# Patient Record
Sex: Female | Born: 1970 | Hispanic: Yes | Marital: Married | Smoking: Never smoker
Health system: Southern US, Community
[De-identification: ages and names within clinical notes are randomized; demographics above are authoritative.]

## PROBLEM LIST (undated history)

## (undated) HISTORY — PX: CHOLECYSTECTOMY: SHX55

---

## 2005-10-08 ENCOUNTER — Emergency Department: Payer: Self-pay | Admitting: Emergency Medicine

## 2006-12-01 ENCOUNTER — Emergency Department: Payer: Self-pay | Admitting: Emergency Medicine

## 2007-01-12 ENCOUNTER — Emergency Department: Payer: Self-pay | Admitting: Unknown Physician Specialty

## 2007-04-04 ENCOUNTER — Encounter: Payer: Self-pay | Admitting: Maternal & Fetal Medicine

## 2007-08-20 ENCOUNTER — Inpatient Hospital Stay: Payer: Self-pay

## 2015-07-07 ENCOUNTER — Encounter: Payer: Self-pay | Admitting: Urgent Care

## 2015-07-07 ENCOUNTER — Emergency Department
Admission: EM | Admit: 2015-07-07 | Discharge: 2015-07-08 | Disposition: A | Discharge status: Home / Self Care | Payer: Self-pay | Attending: Emergency Medicine | Admitting: Emergency Medicine

## 2015-07-07 DIAGNOSIS — G43809 Other migraine, not intractable, without status migrainosus: Secondary | ICD-10-CM | POA: Insufficient documentation

## 2015-07-07 LAB — CBC
HEMATOCRIT: 35.1 % (ref 35.0–47.0)
HEMOGLOBIN: 11.9 g/dL — AB (ref 12.0–16.0)
MCH: 29.2 pg (ref 26.0–34.0)
MCHC: 33.9 g/dL (ref 32.0–36.0)
MCV: 86.2 fL (ref 80.0–100.0)
Platelets: 259 10*3/uL (ref 150–440)
RBC: 4.08 MIL/uL (ref 3.80–5.20)
RDW: 13.9 % (ref 11.5–14.5)
WBC: 9.2 10*3/uL (ref 3.6–11.0)

## 2015-07-07 MED ORDER — ONDANSETRON 4 MG PO TBDP: 4.0000 mg | ORAL_TABLET | Freq: Once | ORAL | Status: DC

## 2015-07-07 MED ORDER — HYDROMORPHONE HCL 1 MG/ML IJ SOLN
INTRAMUSCULAR | Status: AC
  Filled 2015-07-07: qty 1

## 2015-07-07 MED ORDER — KETOROLAC TROMETHAMINE 10 MG PO TABS: 10.0000 mg | ORAL_TABLET | Freq: Three times a day (TID) | ORAL | Status: DC | PRN

## 2015-07-07 MED ORDER — ONDANSETRON 4 MG PO TBDP
ORAL_TABLET | ORAL | Status: AC
  Filled 2015-07-07: qty 1

## 2015-07-07 MED ORDER — METOCLOPRAMIDE HCL 10 MG PO TABS
ORAL_TABLET | ORAL | Status: AC
  Filled 2015-07-07: qty 1

## 2015-07-07 MED ORDER — HYDROMORPHONE HCL 1 MG/ML IJ SOLN: 1.0000 mg | Freq: Once | INTRAMUSCULAR | Status: DC

## 2015-07-07 MED ORDER — DIPHENHYDRAMINE HCL 25 MG PO CAPS
ORAL_CAPSULE | ORAL | Status: AC
  Filled 2015-07-07: qty 1

## 2015-07-07 MED ORDER — HYDROMORPHONE HCL 1 MG/ML IJ SOLN
1.0000 mg | Freq: Once | INTRAMUSCULAR | Status: AC
  Administered 2015-07-07: 1 mg via INTRAMUSCULAR

## 2015-07-07 MED ORDER — METOCLOPRAMIDE HCL 10 MG PO TABS
10.0000 mg | ORAL_TABLET | Freq: Once | ORAL | Status: AC
  Administered 2015-07-07: 10 mg via ORAL
  Filled 2015-07-07: qty 1

## 2015-07-07 MED ORDER — SODIUM CHLORIDE 0.9 % IV BOLUS (SEPSIS)
1000.0000 mL | Freq: Once | INTRAVENOUS | Status: AC
  Administered 2015-07-07: 1000 mL via INTRAVENOUS

## 2015-07-07 MED ORDER — DIPHENHYDRAMINE HCL 25 MG PO CAPS
25.0000 mg | ORAL_CAPSULE | Freq: Once | ORAL | Status: AC
  Administered 2015-07-07: 25 mg via ORAL
  Filled 2015-07-07: qty 1

## 2015-07-07 MED ORDER — KETOROLAC TROMETHAMINE 60 MG/2ML IM SOLN
60.0000 mg | Freq: Once | INTRAMUSCULAR | Status: AC
  Administered 2015-07-07: 60 mg via INTRAMUSCULAR
  Filled 2015-07-07 (×2): qty 2

## 2015-07-07 MED ORDER — ONDANSETRON 4 MG PO TBDP
4.0000 mg | ORAL_TABLET | Freq: Once | ORAL | Status: AC
  Administered 2015-07-07: 4 mg via ORAL

## 2015-07-07 MED ORDER — DEXAMETHASONE SODIUM PHOSPHATE 10 MG/ML IJ SOLN
10.0000 mg | Freq: Once | INTRAMUSCULAR | Status: AC
  Administered 2015-07-07: 10 mg via INTRAVENOUS
  Filled 2015-07-07: qty 1

## 2015-07-07 MED ORDER — METOCLOPRAMIDE HCL 5 MG PO TABS: 5.0000 mg | ORAL_TABLET | Freq: Three times a day (TID) | ORAL | Status: DC | PRN

## 2015-07-07 NOTE — ED Provider Notes (Signed)
[facility] Emergency Department Provider Note  ____________________________________________  Time seen: Approximately 10:02 PM  I have reviewed the triage vital signs and the nursing notes.   HISTORY  Chief Complaint Headache    HPI Theresa Manning is a 45 y.o. female with a history of headaches presenting with headache. The patient reports that for a week she has had a progressively worsening daily constant headache. It is mostly frontal and associated with photophobia. She has had associated nausea but no vomiting. No tick bites, travel outside denies states, trauma, or rash. She is not had any fever or stiff neck. He has tried 400 mg of ibuprofen without improvement.   History reviewed. No pertinent past medical history.  There are no active problems to display for this patient.   History reviewed. No pertinent past surgical history.  Current Outpatient Rx  Name  Route  Sig  Dispense  Refill  . ketorolac (TORADOL) 10 MG tablet   Oral   Take 1 tablet (10 mg total) by mouth every 8 (eight) hours as needed for moderate pain (with food).   15 tablet   0   . metoCLOPramide (REGLAN) 5 MG tablet   Oral   Take 1 tablet (5 mg total) by mouth every 8 (eight) hours as needed for nausea or vomiting.   12 tablet   0     Allergies Review of patient's allergies indicates no known allergies.  No family history on file.  Social History Social History  Substance Use Topics  . Smoking status: Never Smoker   . Smokeless tobacco: None  . Alcohol Use: No    Review of Systems Constitutional: No fever/chills.No trauma. Eyes: No visual changes. Positive photophobia. ENT: No sore throat. No congestion or rhinorrhea. Cardiovascular: Denies chest pain. Denies palpitations. Respiratory: Denies shortness of breath.  No cough. Gastrointestinal: No abdominal pain.  No nausea, no vomiting.  No diarrhea.  No constipation. Genitourinary: Negative for  dysuria. Musculoskeletal: Negative for back pain. Skin: Negative for rash. Neurological: Positive for headaches. No focal numbness, tingling or weakness. No difficulty walking.  10-point ROS otherwise negative.  ____________________________________________   PHYSICAL EXAM:  VITAL SIGNS: ED Triage Vitals  Enc Vitals Group     BP 07/07/15 1955 118/69 mmHg     Pulse Rate 07/07/15 1955 57     Resp 07/07/15 1955 16     Temp 07/07/15 1955 98.6 F (37 C)     Temp Source 07/07/15 1955 Oral     SpO2 07/07/15 1955 98 %     Weight 07/07/15 1955 178 lb (80.74 kg)     Height 07/07/15 1955  (1.676 m)     Head Cir --      Peak Flow --      Pain Score 07/07/15 1955 10     Pain Loc --      Pain Edu? --      Excl. in GC? --     Constitutional: Alert and oriented. Mildly uncomfortable appearing but nontoxic.Marland Kitchen Answers questions appropriately. Eyes: Conjunctivae are normal.  EOMI. PERRLA. No scleral icterus. No eye discharge. Head: Atraumatic. Nose: No congestion/rhinnorhea. Mouth/Throat: Mucous membranes are moist.  Neck: No stridor.  Supple.  No stiff neck or meningismus. Cardiovascular: Normal rate, regular rhythm. No murmurs, rubs or gallops.  Respiratory: Normal respiratory effort.  No accessory muscle use or retractions. Lungs CTAB.  No wheezes, rales or ronchi. Gastrointestinal: Soft, nontender and nondistended.  No guarding or rebound.  No peritoneal  signs. Musculoskeletal: No LE edema. No ttp in the calves or palpable cords.  Negative Homan's sign. Neurologic:  Alert and oriented 3. Speech is clear. Face and smile are symmetric. EOMI and PERRLA. No nystagmus. Moves all extremities well. Gait without ataxia.  Skin:  Skin is warm, dry and intact. No rash noted. Psychiatric: Mood and affect are normal. Speech and behavior are normal.  Normal judgement.  ____________________________________________   LABS (all labs ordered are listed, but only abnormal results are  displayed)  Labs Reviewed  CBC  BASIC METABOLIC PANEL  URINALYSIS COMPLETEWITH MICROSCOPIC ([facility] ONLY)  HCG, QUANTITATIVE, PREGNANCY   ____________________________________________  EKG  Not indicated ____________________________________________  RADIOLOGY  No results found.  ____________________________________________   PROCEDURES  Procedure(s) performed: None  Critical Care performed: No ____________________________________________   INITIAL IMPRESSION / ASSESSMENT AND PLAN / ED COURSE  Pertinent labs & imaging results that were available during my care of the patient were reviewed by me and considered in my medical decision making (see chart for details).  45 y.o. female with a history of headaches presenting with 1 week of progressively worsening headache with photophobia. On my exam it did not see an acute neurologic deficits. She is not febrile and has no meningismus. Think the most likely cause of her headache is a migraine, and intracranial mass, intracranial bleeding, or meningitis is much less likely. I will plan to treat her symptomatically and reevaluate her.  ----------------------------------------- 10:41 PM on 07/07/2015 -----------------------------------------  The patient states that she has not felt any improvement in her pain. I will re-dose her with pain medication and an antiemetic, and reevaluate the patient. At this time, she remains hemodynamically stable and is resting comfortably in the stretcher.  ----------------------------------------- 11:00 PM on 07/07/2015 -----------------------------------------  The patient's pain continues to be 10 out of 10 and she continues to be photosensitive. I will initiate basic laboratory studies, and an IV for increased pain control. I will sign her out to the oncoming physician, who will reevaluate her pain and follow up her studies. Final disposition to be made by the oncoming  doctor.  ____________________________________________  FINAL CLINICAL IMPRESSION(S) / ED DIAGNOSES  Final diagnoses:  Other migraine without status migrainosus, not intractable      NEW MEDICATIONS STARTED DURING THIS VISIT:  New Prescriptions   KETOROLAC (TORADOL) 10 MG TABLET    Take 1 tablet (10 mg total) by mouth every 8 (eight) hours as needed for moderate pain (with food).   METOCLOPRAMIDE (REGLAN) 5 MG TABLET    Take 1 tablet (5 mg total) by mouth every 8 (eight) hours as needed for nausea or vomiting.     Rockne MenghiniAnne-Caroline Adalyna Godbee, MD 07/07/15 [phone]

## 2015-07-07 NOTE — ED Notes (Signed)
Patient presents with a non-specific headache x 1 week. Denies N/V, visual changes, and neck pain. NOS reported at this time. Patient grossly Neuro intact in triage.

## 2015-07-07 NOTE — Discharge Instructions (Signed)
Please get plenty of rest, drink plenty of fluid and eat small regular meals at the day to prevent headache. If you develop a headache, take Toradol and Reglan immediately. Do not wait until the headache has worsened, as it is much more difficult to treat at that point. Do not take any other NSAID medications such as Aleve, ibuprofen, Motrin or Advil if you're taking Toradol.  Return to the emergency department if he develops severe pain, visual changes, inability to keep down fluids, fever, or any other symptoms concerning to you.  Cefalea migraosa (Migraine Headache) Una cefalea migraosa es un dolor intenso y punzante en uno o ambos lados de la cabeza. La migraa puede durar desde 30 minutos hasta varias horas. CAUSAS  No siempre se conoce la causa exacta de la cefalea migraosa. Sin embargo, IT consultantpueden aparecer Circuit Citycuando los nervios del cerebro se irritan y liberan ciertas sustancias qumicas que causan inflamacin. Esto ocasiona dolor. Existen tambin ciertos factores que pueden desencadenar las migraas, como los siguientes:  Alcohol.  Fumar.  Estrs.  La menstruacin  Quesos aejados.  Los alimentos o las bebidas que contienen nitratos, glutamato, aspartamo o tiramina.  Falta de sueo.  Chocolate.  Cafena.  Hambre.  Actividad fsica extenuante.  Fatiga.  Medicamentos que se usan para tratar Aeronautical engineerel dolor en el pecho (nitroglicerina), pldoras anticonceptivas, estrgeno y algunos medicamentos para la hipertensin arterial. SIGNOS Y SNTOMAS  Dolor en uno o ambos lados de la cabeza.  Dolor pulsante o punzante.  Dolor intenso que impide Ameren Corporationrealizar las actividades diarias.  Dolor que se agrava por cualquier actividad fsica.  Nuseas, vmitos o ambos.  Mareos.  Dolor con la exposicin a las luces brillantes, a los ruidos fuertes o la [place]actividad.  Sensibilidad general a las luces brillantes, a los ruidos fuertes o a los Limited Brandsolores. Antes de sufrir una migraa, puede recibir  seales de advertencia (aura). Un aura puede incluir:  Ver las luces intermitentes.  Ver puntos brillantes, halos o lneas en zigzag.  Tener una visin en tnel o visin borrosa.  Sensacin de entumecimiento u hormigueo.  Dificultad para hablar  Debilidad muscular. DIAGNSTICO  La cefalea migraosa se diagnostica en funcin de lo siguiente:  Sntomas.  Examen fsico.  Neomia DearUna TC (tomografa computada) o resonancia magntica de la cabeza. Estas pruebas de diagnstico por imagen no pueden diagnosticar las migraas, pero pueden ayudar a Sales promotion account executivedescartar otras causas de las cefaleas. TRATAMIENTO Le prescribirn medicamentos para Engineer, materialsaliviar el dolor y las nuseas. Tambin podrn administrarse medicamentos para ayudar a Armed forces training and education officerprevenir las migraas recurrentes.  INSTRUCCIONES PARA EL CUIDADO EN EL HOGAR  Slo tome medicamentos de venta libre o recetados para Primary school teachercalmar el dolor o Environmental health practitionerel malestar, segn las indicaciones de su mdico. No se recomienda usar los opiceos a Air cabin crewlargo plazo.  Cuando tenga la migraa, acustese en un cuarto oscuro y tranquilo  Lleve un registro diario para Financial risk analystaveriguar lo que puede provocar las Soil scientistcefaleas migraosas. Por ejemplo, escriba:  Lo que usted come y bebe.  Cunto tiempo duerme.  Algn cambio en su dieta o en los medicamentos.  Limite el consumo de bebidas alcohlicas.  Si fuma, deje de hacerlo.  Duerma entre 7 y 9horas, o segn las recomendaciones del mdico.  Limite el estrs.  Mantenga las luces tenues si le Goodrich Corporationmolestan las luces brillantes y la Mapleviewmigraa empeora. SOLICITE ATENCIN MDICA DE INMEDIATO SI:   La migraa se hace cada vez ms intensa.  Tiene fiebre.  Presenta rigidez en el cuello.  Tiene prdida de visin.  Presenta debilidad muscular  o prdida del control muscular.  Comienza a perder el equilibrio o tiene problemas para caminar.  Sufre mareos o se desmaya.  Tiene sntomas graves que son diferentes a los primeros sntomas. ASEGRESE DE QUE:    Comprende estas instrucciones.  Controlar su afeccin.  Recibir ayuda de inmediato si no mejora o si empeora.   Esta informacin no tiene Theme park manager el consejo del mdico. Asegrese de hacerle al mdico cualquier pregunta que tenga.   Document Released: 02/06/2005 Document Revised: 11/27/2012 Elsevier Interactive Patient Education [employer]

## 2015-07-07 NOTE — ED Notes (Signed)
Pt assessment done through interpreter

## 2015-07-08 ENCOUNTER — Emergency Department: Payer: Self-pay

## 2015-07-08 LAB — BASIC METABOLIC PANEL
ANION GAP: 7 (ref 5–15)
BUN: 13 mg/dL (ref 6–20)
CALCIUM: 8.7 mg/dL — AB (ref 8.9–10.3)
CO2: 24 mmol/L (ref 22–32)
Chloride: 108 mmol/L (ref 101–111)
Creatinine, Ser: 0.58 mg/dL (ref 0.44–1.00)
GFR calc Af Amer: >60 mL/min (ref >60)
GLUCOSE: 94 mg/dL (ref 65–99)
Potassium: 3.3 mmol/L — ABNORMAL LOW (ref 3.5–5.1)
SODIUM: 139 mmol/L (ref 135–145)

## 2015-07-08 LAB — URINALYSIS COMPLETE WITH MICROSCOPIC (ARMC ONLY)
BILIRUBIN URINE: NEGATIVE
GLUCOSE, UA: NEGATIVE mg/dL
Hgb urine dipstick: NEGATIVE
KETONES UR: NEGATIVE mg/dL
LEUKOCYTES UA: NEGATIVE
Nitrite: NEGATIVE
Protein, ur: NEGATIVE mg/dL
Specific Gravity, Urine: 1.028 (ref 1.005–1.030)
pH: 5 (ref 5.0–8.0)

## 2015-07-08 LAB — HCG, QUANTITATIVE, PREGNANCY: hCG, Beta Chain, Quant, S: <1 m[IU]/mL (ref <5)

## 2015-07-08 LAB — POCT PREGNANCY, URINE: PREG TEST UR: NEGATIVE

## 2015-07-08 MED ORDER — IOPAMIDOL (ISOVUE-370) INJECTION 76%
100.0000 mL | Freq: Once | INTRAVENOUS | Status: AC | PRN
  Administered 2015-07-08: 100 mL via INTRAVENOUS

## 2015-07-08 MED ORDER — OXYCODONE-ACETAMINOPHEN 5-325 MG PO TABS
2.0000 | ORAL_TABLET | Freq: Once | ORAL | Status: AC
  Administered 2015-07-08: 2 via ORAL
  Filled 2015-07-08: qty 2

## 2015-07-08 NOTE — ED Notes (Signed)
Urine preg negative

## 2018-05-04 ENCOUNTER — Other Ambulatory Visit: Payer: Self-pay

## 2018-05-04 ENCOUNTER — Encounter: Payer: Self-pay | Admitting: Emergency Medicine

## 2018-05-04 ENCOUNTER — Emergency Department: Payer: 59

## 2018-05-04 ENCOUNTER — Emergency Department
Admission: EM | Admit: 2018-05-04 | Discharge: 2018-05-05 | Disposition: A | Discharge status: Home / Self Care | Payer: 59 | Attending: Emergency Medicine | Admitting: Emergency Medicine

## 2018-05-04 DIAGNOSIS — R197 Diarrhea, unspecified: Secondary | ICD-10-CM | POA: Insufficient documentation

## 2018-05-04 DIAGNOSIS — N390 Urinary tract infection, site not specified: Secondary | ICD-10-CM

## 2018-05-04 DIAGNOSIS — R1011 Right upper quadrant pain: Secondary | ICD-10-CM | POA: Diagnosis present

## 2018-05-04 DIAGNOSIS — Z209 Contact with and (suspected) exposure to unspecified communicable disease: Secondary | ICD-10-CM | POA: Insufficient documentation

## 2018-05-04 DIAGNOSIS — R109 Unspecified abdominal pain: Secondary | ICD-10-CM

## 2018-05-04 LAB — CBC
HEMATOCRIT: 40.8 % (ref 36.0–46.0)
Hemoglobin: 13.2 g/dL (ref 12.0–15.0)
MCH: 28.8 pg (ref 26.0–34.0)
MCHC: 32.4 g/dL (ref 30.0–36.0)
MCV: 89.1 fL (ref 80.0–100.0)
Platelets: 321 10*3/uL (ref 150–400)
RBC: 4.58 MIL/uL (ref 3.87–5.11)
RDW: 13.7 % (ref 11.5–15.5)
WBC: 11.8 10*3/uL — AB (ref 4.0–10.5)
nRBC: 0 % (ref 0.0–0.2)

## 2018-05-04 LAB — COMPREHENSIVE METABOLIC PANEL
ALBUMIN: 4.3 g/dL (ref 3.5–5.0)
ALT: 27 U/L (ref 0–44)
AST: 19 U/L (ref 15–41)
Alkaline Phosphatase: 79 U/L (ref 38–126)
Anion gap: 7 (ref 5–15)
BILIRUBIN TOTAL: 1.5 mg/dL — AB (ref 0.3–1.2)
BUN: 12 mg/dL (ref 6–20)
CHLORIDE: 108 mmol/L (ref 98–111)
CO2: 24 mmol/L (ref 22–32)
CREATININE: 0.56 mg/dL (ref 0.44–1.00)
Calcium: 8.5 mg/dL — ABNORMAL LOW (ref 8.9–10.3)
GFR calc non Af Amer: >60 mL/min (ref >60)
Glucose, Bld: 93 mg/dL (ref 70–99)
Potassium: 3.7 mmol/L (ref 3.5–5.1)
SODIUM: 139 mmol/L (ref 135–145)
TOTAL PROTEIN: 7.2 g/dL (ref 6.5–8.1)

## 2018-05-04 LAB — URINALYSIS, COMPLETE (UACMP) WITH MICROSCOPIC
BILIRUBIN URINE: NEGATIVE
Glucose, UA: NEGATIVE mg/dL
KETONES UR: NEGATIVE mg/dL
Nitrite: NEGATIVE
PROTEIN: NEGATIVE mg/dL
Specific Gravity, Urine: 1.02 (ref 1.005–1.030)
pH: 6 (ref 5.0–8.0)

## 2018-05-04 LAB — POCT PREGNANCY, URINE: PREG TEST UR: NEGATIVE

## 2018-05-04 LAB — LIPASE, BLOOD: Lipase: 22 U/L (ref 11–51)

## 2018-05-04 MED ORDER — IOHEXOL 300 MG/ML  SOLN
100.0000 mL | Freq: Once | INTRAMUSCULAR | Status: AC | PRN
  Administered 2018-05-04: 100 mL via INTRAVENOUS

## 2018-05-04 MED ORDER — IOPAMIDOL (ISOVUE-300) INJECTION 61%
30.0000 mL | Freq: Once | INTRAVENOUS | Status: AC | PRN
  Administered 2018-05-04: 30 mL via ORAL

## 2018-05-04 MED ORDER — ONDANSETRON HCL 4 MG/2ML IJ SOLN
4.0000 mg | Freq: Once | INTRAMUSCULAR | Status: AC
  Administered 2018-05-04: 4 mg via INTRAVENOUS
  Filled 2018-05-04: qty 2

## 2018-05-04 MED ORDER — SODIUM CHLORIDE 0.9 % IV BOLUS: 1000.0000 mL | Freq: Once | INTRAVENOUS | Status: DC

## 2018-05-04 MED ORDER — SODIUM CHLORIDE 0.9 % IV SOLN
1.0000 g | Freq: Once | INTRAVENOUS | Status: AC
  Administered 2018-05-04: 1 g via INTRAVENOUS
  Filled 2018-05-04: qty 10

## 2018-05-04 MED ORDER — SODIUM CHLORIDE 0.9% FLUSH: 3.0000 mL | Freq: Once | INTRAVENOUS | Status: DC

## 2018-05-04 MED ORDER — CEPHALEXIN 500 MG PO CAPS: 500.0000 mg | ORAL_CAPSULE | Freq: Three times a day (TID) | ORAL | 0 refills | Status: AC

## 2018-05-04 MED ORDER — FENTANYL CITRATE (PF) 100 MCG/2ML IJ SOLN
50.0000 ug | Freq: Once | INTRAMUSCULAR | Status: AC
  Administered 2018-05-04: 50 ug via INTRAVENOUS
  Filled 2018-05-04: qty 2

## 2018-05-04 MED ORDER — ACETAMINOPHEN 325 MG PO TABS
650.0000 mg | ORAL_TABLET | Freq: Once | ORAL | Status: AC
  Administered 2018-05-05: 650 mg via ORAL
  Filled 2018-05-04: qty 2

## 2018-05-04 NOTE — ED Triage Notes (Signed)
Nausea, vomiting and abdominal pain began this am.  

## 2018-05-04 NOTE — Discharge Instructions (Addendum)
Si se siente peor de [place], incluyendo fiebre alta o aumento del dolor o vmitos, regrese al departamento de emergencia

## 2018-05-04 NOTE — ED Notes (Signed)
Pt updated about wait. Family verbalizes understanding.

## 2018-05-04 NOTE — ED Provider Notes (Addendum)
[facility] Provider Note  ____________________________________________   I have reviewed the triage vital signs and the nursing notes. Where available I have reviewed prior notes and, if possible and indicated, outside hospital notes.    HISTORY  Chief Complaint Abdominal Pain; Emesis; and Diarrhea    HPI Theresa Manning is a 48 y.o. female who is healthy, states that she is had her appendix out before although the notes that she has had her gallbladder out she adamantly states that is her appendix.  In any event, she is here with nausea vomiting diarrhea for the last 24 hours or so.  She is had fevers at home.  She denies melena bright red blood per rectum or hematemesis.  No recent travel, positive sick contacts.  Patient speaks Spanish, she is very comfortable with my Spanish she does not want me to call for an interpreter at this time, she states that she changes her mind she will let me know.  She feels that there are no barriers to communication between us at this time.  She states that the pain only began after the vomiting.  She states that she has had no headache no cough no rhinorrhea no cold symptoms and she denies dysuria, she is only had urinary frequency. She has had copious amounts of diarrhea and copious amounts of vomiting however.   History reviewed. No pertinent past medical history.  There are no active problems to display for this patient.   Past Surgical History:  Procedure Laterality Date  . CHOLECYSTECTOMY      Prior to Admission medications   Medication Sig Start Date End Date Taking? Authorizing Provider  ketorolac (TORADOL) 10 MG tablet Take 1 tablet (10 mg total) by mouth every 8 (eight) hours as needed for moderate pain (with food). 07/07/15   Rockne MenghiniNorman, Anne-Caroline, MD  metoCLOPramide (REGLAN) 5 MG tablet Take 1 tablet (5 mg total) by mouth every 8 (eight) hours as needed for nausea or vomiting.  07/07/15 07/06/16  Rockne MenghiniNorman, Anne-Caroline, MD    Allergies Patient has no known allergies.  No family history on file.  Social History Social History   Tobacco Use  . Smoking status: Never Smoker  Substance Use Topics  . Alcohol use: No  . Drug use: Not on file    Review of Systems Constitutional: + fever/chills Eyes: No visual changes. ENT: No sore throat. No stiff neck no neck pain Cardiovascular: Denies chest pain. Respiratory: Denies shortness of breath. Gastrointestinal:   See HPI Genitourinary: Negative for dysuria. Musculoskeletal: Negative lower extremity swelling Skin: Negative for rash. Neurological: Negative for severe headaches, focal weakness or numbness.   ____________________________________________   PHYSICAL EXAM:  VITAL SIGNS: ED Triage Vitals  Enc Vitals Group     BP 05/04/18 1656 (!) 145/90     Pulse Rate 05/04/18 1656 84     Resp 05/04/18 1656 20     Temp 05/04/18 1656 (!) 100.7 F (38.2 C)     Temp Source 05/04/18 1656 Oral     SpO2 05/04/18 1656 99 %     Weight 05/04/18 1658 197 lb (89.4 kg)     Height --      Head Circumference --      Peak Flow --      Pain Score 05/04/18 1657 8     Pain Loc --      Pain Edu? --      Excl. in GC? --  Constitutional: Alert and oriented. Well appearing and in no acute distress. Eyes: Conjunctivae are normal Head: Atraumatic HEENT: No congestion/rhinnorhea. Mucous membranes are moist.  Oropharynx non-erythematous Neck:   Nontender with no meningismus, no masses, no stridor Cardiovascular: Normal rate, regular rhythm. Grossly normal heart sounds.  Good peripheral circulation. Respiratory: Normal respiratory effort.  No retractions. Lungs CTAB. Abdominal: Soft and positive right upper quadrant epigastric discomfort surgical abdomen. No distention. No guarding no rebound Back:  There is no focal tenderness or step off.  there is no midline tenderness there are no lesions noted. there is no CVA  tenderness Musculoskeletal: No lower extremity tenderness, no upper extremity tenderness. No joint effusions, no DVT signs strong distal pulses no edema Neurologic:  Normal speech and language. No gross focal neurologic deficits are appreciated.  Skin:  Skin is warm, dry and intact. No rash noted. Psychiatric: Mood and affect are normal. Speech and behavior are normal.  ____________________________________________   LABS (all labs ordered are listed, but only abnormal results are displayed)  Labs Reviewed  COMPREHENSIVE METABOLIC PANEL - Abnormal; Notable for the following components:      Result Value   Calcium 8.5 (*)    Total Bilirubin 1.5 (*)    All other components within normal limits  CBC - Abnormal; Notable for the following components:   WBC 11.8 (*)    All other components within normal limits  URINALYSIS, COMPLETE (UACMP) WITH MICROSCOPIC - Abnormal; Notable for the following components:   Color, Urine YELLOW (*)    APPearance HAZY (*)    Hgb urine dipstick MODERATE (*)    Leukocytes,Ua SMALL (*)    Bacteria, UA RARE (*)    All other components within normal limits  URINE CULTURE  LIPASE, BLOOD  POC URINE PREG, ED  POCT PREGNANCY, URINE    Pertinent labs  results that were available during my care of the patient were reviewed by me and considered in my medical decision making (see chart for details). ____________________________________________  EKG  I personally interpreted any EKGs ordered by me or triage Sinus rhythm rate 82 bpm no acute ST elevation or depression nonspecific ST changes no acute ischemia ____________________________________________  RADIOLOGY  Pertinent labs & imaging results that were available during my care of the patient were reviewed by me and considered in my medical decision making (see chart for details). If possible, patient and/or family made aware of any abnormal findings.  No results  found. ____________________________________________    PROCEDURES  Procedure(s) performed: None  Procedures  Critical Care performed: None  ____________________________________________   INITIAL IMPRESSION / ASSESSMENT AND PLAN / ED COURSE  Pertinent labs & imaging results that were available during my care of the patient were reviewed by me and considered in my medical decision making (see chart for details).  Here with nausea vomiting diarrhea, concern for viral syndrome is very high.  There are multiple people in the community with these acting symptoms with a fever at this time I have treated several over the last few days.  However, she does have some upper abdominal discomfort, we will obtain right upper quadrant ultrasound and she insists that she still has her gallbladder, and we will see if the medication fluid and pain medication help her feel better.  Patient's urine is somewhat equivocal, urine culture has been sent.   ----------------------------------------- 11:26 PM on 05/04/2018 -----------------------------------------  In ambulatory no distress feeling better, we will see if CT scan shows any other cause of her abdominal  pain otherwise we will treat her with antibiotics as we are doing for her UTI, and we will discharge her hopefully after that.  CT is pending at this time.  Signed out to Dr. Fanny Bien at the end of my shift    ____________________________________________   FINAL CLINICAL IMPRESSION(S) / ED DIAGNOSES  Final diagnoses:  Abdominal pain      This chart was dictated using voice recognition software.  Despite best efforts to proofread,  errors can occur which can change meaning.   3    Jeanmarie Plant, MD 05/04/18 2054    Jeanmarie Plant, MD 05/04/18 2059    Jeanmarie Plant, MD 05/04/18 [phone]

## 2018-05-04 NOTE — ED Notes (Signed)
Patient transported to Ultrasound 

## 2018-05-06 LAB — URINE CULTURE

## 2019-05-11 ENCOUNTER — Ambulatory Visit: Payer: 59 | Attending: Internal Medicine

## 2019-05-11 DIAGNOSIS — Z23 Encounter for immunization: Secondary | ICD-10-CM

## 2019-05-11 NOTE — Progress Notes (Signed)
   Covid-19 Vaccination Clinic  Name:  Karlita Lichtman    MRN: [id number] DOB: 11/12/1970  05/11/2019  Ms. Maryella Abood was observed post Covid-19 immunization for 15 minutes without incident. She was provided with Vaccine Information Sheet and instruction to access the V-Safe system.   Ms. Nacole Fluhr was instructed to call 911 with any severe reactions post vaccine: Marland Kitchen Difficulty breathing  . Swelling of face and throat  . A fast heartbeat  . A bad rash all over body  . Dizziness and weakness   Immunizations Administered    Name Date Dose VIS Date Route   Pfizer COVID-19 Vaccine 05/11/2019 10:38 AM 0.3 mL 01/31/2019 Intramuscular   Manufacturer: [employer], [employer]   Lot: [id number]   NDC: [address]-7

## 2019-05-31 ENCOUNTER — Ambulatory Visit: Payer: 59 | Attending: Internal Medicine

## 2019-05-31 ENCOUNTER — Emergency Department
Admission: EM | Admit: 2019-05-31 | Discharge: 2019-06-01 | Disposition: A | Discharge status: Home / Self Care | Payer: 59 | Attending: Emergency Medicine | Admitting: Emergency Medicine

## 2019-05-31 ENCOUNTER — Other Ambulatory Visit: Payer: Self-pay

## 2019-05-31 ENCOUNTER — Emergency Department: Payer: 59

## 2019-05-31 ENCOUNTER — Encounter: Payer: Self-pay | Admitting: Emergency Medicine

## 2019-05-31 DIAGNOSIS — Z23 Encounter for immunization: Secondary | ICD-10-CM

## 2019-05-31 DIAGNOSIS — T50Z95A Adverse effect of other vaccines and biological substances, initial encounter: Secondary | ICD-10-CM | POA: Insufficient documentation

## 2019-05-31 DIAGNOSIS — M791 Myalgia, unspecified site: Secondary | ICD-10-CM

## 2019-05-31 DIAGNOSIS — M7918 Myalgia, other site: Secondary | ICD-10-CM | POA: Insufficient documentation

## 2019-05-31 DIAGNOSIS — Z9049 Acquired absence of other specified parts of digestive tract: Secondary | ICD-10-CM | POA: Insufficient documentation

## 2019-05-31 DIAGNOSIS — N390 Urinary tract infection, site not specified: Secondary | ICD-10-CM

## 2019-05-31 LAB — COMPREHENSIVE METABOLIC PANEL
ALT: 34 U/L (ref 0–44)
AST: 32 U/L (ref 15–41)
Albumin: 4.2 g/dL (ref 3.5–5.0)
Alkaline Phosphatase: 78 U/L (ref 38–126)
Anion gap: 6 (ref 5–15)
BUN: 10 mg/dL (ref 6–20)
CO2: 24 mmol/L (ref 22–32)
Calcium: 8.7 mg/dL — ABNORMAL LOW (ref 8.9–10.3)
Chloride: 110 mmol/L (ref 98–111)
Creatinine, Ser: 0.44 mg/dL (ref 0.44–1.00)
GFR calc Af Amer: >60 mL/min (ref >60)
GFR calc non Af Amer: >60 mL/min (ref >60)
Glucose, Bld: 124 mg/dL — ABNORMAL HIGH (ref 70–99)
Potassium: 3.3 mmol/L — ABNORMAL LOW (ref 3.5–5.1)
Sodium: 140 mmol/L (ref 135–145)
Total Bilirubin: 0.6 mg/dL (ref 0.3–1.2)
Total Protein: 7.4 g/dL (ref 6.5–8.1)

## 2019-05-31 LAB — CBC WITH DIFFERENTIAL/PLATELET
Abs Immature Granulocytes: 0.04 10*3/uL (ref 0.00–0.07)
Basophils Absolute: 0.1 10*3/uL (ref 0.0–0.1)
Basophils Relative: 1 %
Eosinophils Absolute: 0.2 10*3/uL (ref 0.0–0.5)
Eosinophils Relative: 2 %
HCT: 37.4 % (ref 36.0–46.0)
Hemoglobin: 12.3 g/dL (ref 12.0–15.0)
Immature Granulocytes: 0 %
Lymphocytes Relative: 30 %
Lymphs Abs: 3 10*3/uL (ref 0.7–4.0)
MCH: 29.1 pg (ref 26.0–34.0)
MCHC: 32.9 g/dL (ref 30.0–36.0)
MCV: 88.4 fL (ref 80.0–100.0)
Monocytes Absolute: 0.6 10*3/uL (ref 0.1–1.0)
Monocytes Relative: 6 %
Neutro Abs: 6.1 10*3/uL (ref 1.7–7.7)
Neutrophils Relative %: 61 %
Platelets: 298 10*3/uL (ref 150–400)
RBC: 4.23 MIL/uL (ref 3.87–5.11)
RDW: 14.2 % (ref 11.5–15.5)
WBC: 10 10*3/uL (ref 4.0–10.5)
nRBC: 0 % (ref 0.0–0.2)

## 2019-05-31 LAB — TROPONIN I (HIGH SENSITIVITY): Troponin I (High Sensitivity): 7 ng/L (ref <18)

## 2019-05-31 MED ORDER — KETOROLAC TROMETHAMINE 30 MG/ML IJ SOLN
10.0000 mg | Freq: Once | INTRAMUSCULAR | Status: AC
  Administered 2019-06-01: 9.9 mg via INTRAVENOUS
  Filled 2019-05-31: qty 1

## 2019-05-31 MED ORDER — ACETAMINOPHEN 500 MG PO TABS
1000.0000 mg | ORAL_TABLET | Freq: Once | ORAL | Status: AC
  Administered 2019-05-31: 22:00:00 1000 mg via ORAL
  Filled 2019-05-31: qty 2

## 2019-05-31 MED ORDER — SODIUM CHLORIDE 0.9 % IV BOLUS
1000.0000 mL | Freq: Once | INTRAVENOUS | Status: AC
  Administered 2019-06-01: 1000 mL via INTRAVENOUS

## 2019-05-31 NOTE — ED Provider Notes (Signed)
[facility] Emergency Department Provider Note   ____________________________________________   First MD Initiated Contact with Patient 05/31/19 2337     (approximate)  I have reviewed the triage vital signs and the nursing notes.   HISTORY  Chief Complaint Allergic Reaction  History via video Spanish interpreter  HPI [name] is a 49 y.o. female brought to the ED via EMS from COVID-19 vaccination site.  Patient had her second dose and approximately 15 minutes afterwards she started to feel bad.  Complains of body aches, dizziness, headache, chest pain and shortness of breath.  Denies fever, cough, abdominal pain, nausea, vomiting, dysuria or diarrhea.       Past Medical History None  There are no problems to display for this patient.   Past Surgical History:  Procedure Laterality Date  . CHOLECYSTECTOMY      Prior to Admission medications   Medication Sig Start Date End Date Taking? Authorizing Provider  cephALEXin (KEFLEX) 500 MG capsule Take 1 capsule (500 mg total) by mouth 3 (three) times daily. 06/01/19   Irean Hong, MD  ketorolac (TORADOL) 10 MG tablet Take 1 tablet (10 mg total) by mouth every 8 (eight) hours as needed for moderate pain (with food). 07/07/15   Rockne Menghini, MD  metoCLOPramide (REGLAN) 5 MG tablet Take 1 tablet (5 mg total) by mouth every 8 (eight) hours as needed for nausea or vomiting. 07/07/15 07/06/16  Rockne Menghini, MD    Allergies Patient has no known allergies.  No family history on file.  Social History Social History   Tobacco Use  . Smoking status: Never Smoker  . Smokeless tobacco: Never Used  Substance Use Topics  . Alcohol use: No  . Drug use: Not on file    Review of Systems  Constitutional: Positive for body aches. No fever/chills Eyes: No visual changes. ENT: No sore throat. Cardiovascular: Positive for chest pain. Respiratory: Positive for shortness of  breath. Gastrointestinal: No abdominal pain.  No nausea, no vomiting.  No diarrhea.  No constipation. Genitourinary: Negative for dysuria. Musculoskeletal: Negative for back pain. Skin: Negative for rash. Neurological: Positive for dizziness.  Negative for headaches, focal weakness or numbness.   ____________________________________________   PHYSICAL EXAM:  VITAL SIGNS: ED Triage Vitals  Enc Vitals Group     BP 05/31/19 1743 (!) 149/81     Pulse Rate 05/31/19 1743 70     Resp 05/31/19 1743 18     Temp 05/31/19 1743 98.6 F (37 C)     Temp Source 05/31/19 1743 Oral     SpO2 05/31/19 1743 98 %     Weight 05/31/19 1748 175 lb (79.4 kg)     Height 05/31/19 1748 5\' 3"  (1.6 m)     Head Circumference --      Peak Flow --      Pain Score 05/31/19 2158 8     Pain Loc --      Pain Edu? --      Excl. in GC? --     Constitutional: Alert and oriented. Well appearing and in no acute distress. Eyes: Conjunctivae are normal. PERRL. EOMI. Head: Atraumatic. Nose: No congestion/rhinnorhea. Mouth/Throat: Mucous membranes are moist.   Neck: No stridor.   Cardiovascular: Normal rate, regular rhythm. Grossly normal heart sounds.  Good peripheral circulation. Respiratory: Normal respiratory effort.  No retractions. Lungs CTAB. Gastrointestinal: Soft and nontender to light or deep palpation. No distention. No abdominal bruits. No CVA tenderness. Musculoskeletal: No lower  extremity tenderness nor edema.  No joint effusions. Neurologic:  Normal speech and language. No gross focal neurologic deficits are appreciated. No gait instability. Skin:  Skin is warm, dry and intact. No rash noted. Psychiatric: Mood and affect are normal. Speech and behavior are normal.  ____________________________________________   LABS (all labs ordered are listed, but only abnormal results are displayed)  Labs Reviewed  COMPREHENSIVE METABOLIC PANEL - Abnormal; Notable for the following components:      Result  Value   Potassium 3.3 (*)    Glucose, Bld 124 (*)    Calcium 8.7 (*)    All other components within normal limits  URINALYSIS, COMPLETE (UACMP) WITH MICROSCOPIC - Abnormal; Notable for the following components:   Color, Urine YELLOW (*)    APPearance CLOUDY (*)    Hgb urine dipstick MODERATE (*)    Protein, ur 100 (*)    Leukocytes,Ua TRACE (*)    Bacteria, UA FEW (*)    All other components within normal limits  CBC WITH DIFFERENTIAL/PLATELET  TROPONIN I (HIGH SENSITIVITY)  TROPONIN I (HIGH SENSITIVITY)   ____________________________________________  EKG  ED ECG REPORT I, Nat Lowenthal J, the attending physician, personally viewed and interpreted this ECG.   Date: 05/31/2019  EKG Time: 1751  Rate: 70  Rhythm: normal EKG, normal sinus rhythm  Axis: Normal  Intervals:none  ST&T Change: Nonspecific  ____________________________________________  RADIOLOGY  ED MD interpretation: No acute cardiopulmonary process  Official radiology report(s): DG Chest Port 1 View  Result Date: 06/01/2019 CLINICAL DATA:  Body aches EXAM: PORTABLE CHEST 1 VIEW COMPARISON:  None. FINDINGS: Cardiac size is upper limits of normal. No focal consolidation, pleural effusion, or pneumothorax. IMPRESSION: No active disease. Electronically Signed   By: Donavan Foil M.D.   On: 06/01/2019 00:11    ____________________________________________   PROCEDURES  Procedure(s) performed (including Critical Care):  Procedures   ____________________________________________   INITIAL IMPRESSION / ASSESSMENT AND PLAN / ED COURSE  As part of my medical decision making, I reviewed the following data within the [facility] notes reviewed and incorporated, Labs reviewed, EKG interpreted, Old chart reviewed, Radiograph reviewed and Notes from prior ED visits     Theresa Manning was evaluated in Emergency Department on 06/01/2019 for the symptoms described in the history of present  illness. She was evaluated in the context of the global COVID-19 pandemic, which necessitated consideration that the patient might be at risk for infection with the SARS-CoV-2 virus that causes COVID-19. Institutional protocols and algorithms that pertain to the evaluation of patients at risk for COVID-19 are in a state of rapid change based on information released by regulatory bodies including the CDC and federal and state organizations. These policies and algorithms were followed during the patient's care in the ED.    49 year old otherwise healthy female who presents with multiple medical complaints after receiving second COVID-19 vaccine.  Differential diagnosis includes but is not limited to vaccine reaction, infectious, metabolic etiologies, etc.  Work-up unremarkable thus far; will repeat troponin, check UA and chest x-ray.  Infuse IV fluids, IV Toradol and reassess.   Clinical Course as of May 31 617  Sun Jun 01, 2019  4128 Addendum on chart review: Delay secondary to patient was difficult access.  Patient received IV fluids, Toradol, antibiotic for UTI.  She felt significantly better and was discharged in good and stable condition.   [JS]    Clinical Course User Index [JS] Paulette Blanch, MD  ____________________________________________   FINAL CLINICAL IMPRESSION(S) / ED DIAGNOSES  Final diagnoses:  Myalgia  Vaccine reaction, initial encounter  Urinary tract infection without hematuria, site unspecified     ED Discharge Orders         Ordered    cephALEXin (KEFLEX) 500 MG capsule  3 times daily     06/01/19 0417           Note:  This document was prepared using Dragon voice recognition software and may include unintentional dictation errors.   Irean Hong, MD 06/01/19 [phone]

## 2019-05-31 NOTE — ED Triage Notes (Signed)
Pt presents to ED via [facility] from vaccine site. Pt states had 2nd dose earlier today and she started to feel bad after receiving so they brought her here. Pt states she had dizziness, headache, and SOB after receiving vaccine. Pt states upon arrival that she feels bad however is currently visualized in NAD at this time, VSS upon arrival to ED. Respirations even and unlabored at this time.   Marchelle Folks, interpreter in triage at this time.

## 2019-05-31 NOTE — Progress Notes (Signed)
   Covid-19 Vaccination Clinic  Name:  Lawanna Cecere    MRN: [id number] DOB: June 10, 1970  05/31/2019  Ms. Chinenye Katzenberger was observed post Covid-19 immunization for 15 minutes .  During the observation period, she experienced an adverse reaction with the following symptoms:  dizziness.  Assessment : Time of assessment 16:30 Alert and Oriented   Actions taken:  Vitals sign taken   There were no vitals filed for this visit.  Medications administered: No medication administered.  Disposition: Reports no further symptoms of adverse reaction after observation for 15  minutes. Discharged home. Instructed to call 911 for trouble breathing, rapid heart rate, dizziness, swelling of tongue or throat. The Patient was provided with Vaccine Information Sheet and instruction to access the V-Safe system.    Immunizations Administered    Name Date Dose VIS Date Route   Pfizer COVID-19 Vaccine 05/31/2019  4:07 PM 0.3 mL 01/31/2019 Intramuscular   Manufacturer: [employer], [employer]   Lot: [id number] [id number]   NDC: [address]-7

## 2019-05-31 NOTE — ED Notes (Signed)
Pt updated using spanish interpreter. Pt denies further questions at this time.

## 2019-06-01 ENCOUNTER — Ambulatory Visit: Payer: 59

## 2019-06-01 LAB — URINALYSIS, COMPLETE (UACMP) WITH MICROSCOPIC
Bilirubin Urine: NEGATIVE
Glucose, UA: NEGATIVE mg/dL
Ketones, ur: NEGATIVE mg/dL
Nitrite: NEGATIVE
Protein, ur: 100 mg/dL — AB
Specific Gravity, Urine: 1.029 (ref 1.005–1.030)
pH: 5 (ref 5.0–8.0)

## 2019-06-01 LAB — TROPONIN I (HIGH SENSITIVITY): Troponin I (High Sensitivity): 4 ng/L (ref <18)

## 2019-06-01 MED ORDER — CEPHALEXIN 500 MG PO CAPS: 500.0000 mg | ORAL_CAPSULE | Freq: Three times a day (TID) | ORAL | 0 refills | Status: AC

## 2019-06-01 MED ORDER — CEPHALEXIN 500 MG PO CAPS
500.0000 mg | ORAL_CAPSULE | Freq: Once | ORAL | Status: AC
  Administered 2019-06-01: 500 mg via ORAL
  Filled 2019-06-01: qty 1

## 2019-06-01 NOTE — ED Notes (Signed)
Pt difficult IV start, 2 attempts made by this RN. Additional staff asked to attempt. MD notified

## 2019-06-01 NOTE — Discharge Instructions (Addendum)
1.  Take antibiotic as prescribed (Keflex 500 mg 3 times daily x7 days). 2.  Return to the ER for worsening symptoms, persistent vomiting, difficulty breathing or other concerns. 

## 2019-06-01 NOTE — Progress Notes (Signed)
   Covid-19 Vaccination Clinic  Name:  Theresa Manning    MRN: [id number] DOB: 27-Mar-1970  05/31/2019  [name] was observed post Covid-19 immunization for 15 minutes .  During the observation period, she experienced an adverse reaction with the following symptoms:  difficulty breathing and chest pain.  Assessment : Time of assessment 1645. Alert and oriented and Pallor.  Actions taken:  Vitals sign taken  EMS called at  1646. VAERS form obtained and completed by S. Shamarra Warda, MSN RN-BC. Hand off to EMS upon arrival at 1650. 1645  Vital Signs  B/P 167/80   HR 75  O2 Sat 98%   Medications administered: No medication administered.  Disposition:Patient evaluated and transferred to the hospital for further evaluation and care. Time of transport: 1700.   Immunizations Administered    Name Date Dose VIS Date Route   Pfizer COVID-19 Vaccine 05/31/2019  4:07 PM 0.3 mL 01/31/2019 Intramuscular   Manufacturer: [employer], [employer]   Lot: [phone]   NDC: [address]-0

## 2019-07-27 ENCOUNTER — Emergency Department
Admission: EM | Admit: 2019-07-27 | Discharge: 2019-07-28 | Disposition: A | Discharge status: Home / Self Care | Payer: Self-pay | Attending: Student | Admitting: Student

## 2019-07-27 ENCOUNTER — Other Ambulatory Visit: Payer: Self-pay

## 2019-07-27 ENCOUNTER — Emergency Department: Payer: Self-pay

## 2019-07-27 DIAGNOSIS — R519 Headache, unspecified: Secondary | ICD-10-CM | POA: Insufficient documentation

## 2019-07-27 LAB — CBC
HCT: 38.8 % (ref 36.0–46.0)
Hemoglobin: 13 g/dL (ref 12.0–15.0)
MCH: 29.4 pg (ref 26.0–34.0)
MCHC: 33.5 g/dL (ref 30.0–36.0)
MCV: 87.8 fL (ref 80.0–100.0)
Platelets: 296 10*3/uL (ref 150–400)
RBC: 4.42 MIL/uL (ref 3.87–5.11)
RDW: 14 % (ref 11.5–15.5)
WBC: 6.7 10*3/uL (ref 4.0–10.5)
nRBC: 0 % (ref 0.0–0.2)

## 2019-07-27 LAB — URINALYSIS, COMPLETE (UACMP) WITH MICROSCOPIC
Bilirubin Urine: NEGATIVE
Glucose, UA: NEGATIVE mg/dL
Ketones, ur: NEGATIVE mg/dL
Leukocytes,Ua: NEGATIVE
Nitrite: NEGATIVE
Protein, ur: 30 mg/dL — AB
RBC / HPF: >50 RBC/hpf — ABNORMAL HIGH (ref 0–5)
Specific Gravity, Urine: 1.026 (ref 1.005–1.030)
pH: 5 (ref 5.0–8.0)

## 2019-07-27 LAB — BASIC METABOLIC PANEL
Anion gap: 6 (ref 5–15)
BUN: 11 mg/dL (ref 6–20)
CO2: 21 mmol/L — ABNORMAL LOW (ref 22–32)
Calcium: 9 mg/dL (ref 8.9–10.3)
Chloride: 112 mmol/L — ABNORMAL HIGH (ref 98–111)
Creatinine, Ser: 0.72 mg/dL (ref 0.44–1.00)
GFR calc Af Amer: >60 mL/min (ref >60)
GFR calc non Af Amer: >60 mL/min (ref >60)
Glucose, Bld: 97 mg/dL (ref 70–99)
Potassium: 3.4 mmol/L — ABNORMAL LOW (ref 3.5–5.1)
Sodium: 139 mmol/L (ref 135–145)

## 2019-07-27 LAB — PREGNANCY, URINE: Preg Test, Ur: NEGATIVE

## 2019-07-27 LAB — TROPONIN I (HIGH SENSITIVITY): Troponin I (High Sensitivity): 4 ng/L (ref <18)

## 2019-07-27 MED ORDER — GADOBUTROL 1 MMOL/ML IV SOLN
7.5000 mL | Freq: Once | INTRAVENOUS | Status: AC | PRN
  Administered 2019-07-27: 7.5 mL via INTRAVENOUS

## 2019-07-27 MED ORDER — DIPHENHYDRAMINE HCL 50 MG/ML IJ SOLN
25.0000 mg | Freq: Once | INTRAMUSCULAR | Status: AC
  Administered 2019-07-27: 25 mg via INTRAVENOUS
  Filled 2019-07-27: qty 1

## 2019-07-27 MED ORDER — SODIUM CHLORIDE 0.9 % IV BOLUS
1000.0000 mL | Freq: Once | INTRAVENOUS | Status: AC
  Administered 2019-07-27: 1000 mL via INTRAVENOUS

## 2019-07-27 MED ORDER — METOCLOPRAMIDE HCL 5 MG/ML IJ SOLN
10.0000 mg | Freq: Once | INTRAMUSCULAR | Status: AC
  Administered 2019-07-27: 10 mg via INTRAVENOUS
  Filled 2019-07-27: qty 2

## 2019-07-27 MED ORDER — ACETAMINOPHEN 500 MG PO TABS
1000.0000 mg | ORAL_TABLET | Freq: Once | ORAL | Status: AC
  Administered 2019-07-27: 1000 mg via ORAL
  Filled 2019-07-27: qty 2

## 2019-07-27 MED ORDER — KETOROLAC TROMETHAMINE 30 MG/ML IJ SOLN
15.0000 mg | Freq: Once | INTRAMUSCULAR | Status: AC
  Administered 2019-07-27: 15 mg via INTRAVENOUS
  Filled 2019-07-27: qty 1

## 2019-07-27 NOTE — ED Provider Notes (Signed)
[facility] Emergency Department Provider Note  ____________________________________________   First MD Initiated Contact with Patient 07/27/19 2133     (approximate)  I have reviewed the triage vital signs and the nursing notes.  History  Chief Complaint Headache and Dizziness    HPI Theresa Manning is a 49 y.o. female who presents to the ER for headache. Patient states she has had an ongoing headache for about 2 months now. She states it started after receiving her COVID vaccine. Since then she has had a near constant headache.  Located to her forehead and occipital area.  Described as a pressure, throbbing.  Associated with nausea and light sensitivity.  States she was diagnosed with migraines, and was told these may have been triggered by her vaccine.  She has been prescribed medication for this, but does not feel like it is very effective.  Over the last few days the headache has become more severe, which is what prompted her to seek care.  No weakness, numbness, tingling.  No speech difficulties.  She feels like her vision is not quite as clear, like her glasses are not working as good as they normally do.  She is not on any hormonal medications.  She cannot remember which specific vaccine she got.  History obtained with the assistance of a Spanish interpreter   Past Medical Hx Migraines  Problem List There are no problems to display for this patient.   Past Surgical Hx Past Surgical History:  Procedure Laterality Date  . CHOLECYSTECTOMY      Medications Prior to Admission medications   Medication Sig Start Date End Date Taking? Authorizing Provider  cephALEXin (KEFLEX) 500 MG capsule Take 1 capsule (500 mg total) by mouth 3 (three) times daily. 06/01/19   Paulette Blanch, MD  ketorolac (TORADOL) 10 MG tablet Take 1 tablet (10 mg total) by mouth every 8 (eight) hours as needed for moderate pain (with food). 07/07/15   Eula Listen, MD    metoCLOPramide (REGLAN) 5 MG tablet Take 1 tablet (5 mg total) by mouth every 8 (eight) hours as needed for nausea or vomiting. 07/07/15 07/06/16  Eula Listen, MD    Allergies Patient has no known allergies.  Family Hx History reviewed. No pertinent family history.  Social Hx Social History   Tobacco Use  . Smoking status: Never Smoker  . Smokeless tobacco: Never Used  Substance Use Topics  . Alcohol use: No  . Drug use: Not on file     Review of Systems  Constitutional: Negative for fever. Negative for chills. Eyes: Negative for visual changes. ENT: Negative for sore throat. Cardiovascular: Negative for chest pain. Respiratory: Negative for shortness of breath. Gastrointestinal: Negative for nausea. Negative for vomiting.  Genitourinary: Negative for dysuria. Musculoskeletal: Negative for leg swelling. Skin: Negative for rash. Neurological: + for headaches.   Physical Exam  Vital Signs: ED Triage Vitals  Enc Vitals Group     BP 07/27/19 1550 116/72     Pulse Rate 07/27/19 1550 60     Resp 07/27/19 1550 18     Temp 07/27/19 1550 98.3 F (36.8 C)     Temp Source 07/27/19 1550 Oral     SpO2 07/27/19 1550 100 %     Weight 07/27/19 1552 184 lb 15.5 oz (83.9 kg)     Height 07/27/19 1552 5\' 7"  (1.702 m)     Head Circumference --      Peak Flow --  Pain Score 07/27/19 1555 10     Pain Loc --      Pain Edu? --      Excl. in GC? --     Constitutional: Alert and oriented. Well appearing. NAD.  Head: Normocephalic. Atraumatic. Eyes: Conjunctivae clear. Sclera anicteric. Pupils equal and symmetric. EOMI.  Nose: No masses or lesions. No congestion or rhinorrhea. Mouth/Throat: Wearing mask.  Neck: No stridor. Trachea midline.  Cardiovascular: Normal rate, regular rhythm. Extremities well perfused. Respiratory: Normal respiratory effort.  Lungs CTAB. Gastrointestinal: Soft. Non-distended. Non-tender.  Genitourinary: Deferred. Musculoskeletal: No lower  extremity edema. No deformities. Neurologic:  Normal speech and language. No gross focal or lateralizing neurologic deficits are appreciated. Alert and oriented.  Face symmetric.  Tongue midline.  Cranial nerves II through XII intact. UE and LE strength 5/5 and symmetric. UE and LE SILT.  Skin: Skin is warm, dry and intact. No rash noted. Psychiatric: Mood and affect are appropriate for situation.  EKG  Personally reviewed and interpreted by myself.   Date: 07/27/19 Time: 1554 Rate: 61 Rhythm: sinus Axis: normal Intervals: PR 148 ms, QRS 108 ms, QTc 442 ms No acute ischemic changes, no acute arrhythmias No STEMI    Radiology  MRV ordered, pending   Procedures  Procedure(s) performed (including critical care):  Procedures   Initial Impression / Assessment and Plan / MDM / ED Course  49 y.o. female who presents to the ED for a headache, which has been ongoing for several months.   Ddx: tension headache, migraine, vaccine side effect. No fever, neck stiffness, or meningismus on exam to suggest infectious etiology.  Headache has been ongoing for quite some time and is chronic, not thunderclap in description, doubt SAH.  No objective neurological findings on exam, doubt CVA.  Would be quite rare, but could consider side effect of vaccine being CVT, as seen in some case studies.  Will plan for basic labs, IV medication control, MRI imaging.  Patient is agreeable.  Basic labs are largely unremarkable.  UA slightly abnormal, but with 6-10 squamous cells, do not feel this represents acute infection.  Patient signed out due to shift change, awaiting MRI results and reevaluation.  If imaging is negative, anticipate discharge.   _______________________________   As part of my medical decision making I have reviewed available labs, radiology tests, reviewed old records/performed chart review.   Final Clinical Impression(s) / ED Diagnosis  Complaint of headache Chronic  headache    Note:  This document was prepared using Dragon voice recognition software and may include unintentional dictation errors.   Miguel Aschoff., MD 07/28/19 [phone]

## 2019-07-27 NOTE — ED Notes (Signed)
Pt reports coming in with head pain.

## 2019-07-27 NOTE — ED Notes (Signed)
Pt taken to MRI  

## 2019-07-27 NOTE — ED Triage Notes (Signed)
Spanish interpreter used for triage. Pt A&O, ambulatory. Speech clear.   Pt states headache x 3 months. States the covid vaccine triggered migraines for her. C/o dizziness as well. Denies blurred vision or vomiting.

## 2019-07-27 NOTE — ED Triage Notes (Signed)
First nurse note- pt here for headache and weakness.  Was here at hospital for same but not better.

## 2019-07-28 NOTE — Discharge Instructions (Addendum)
Your MRI of the brain and other tests were okay and reassuring.  Please follow up with your primary care doctor this week for further evaluation of your symptoms.

## 2019-07-31 NOTE — Congregational Nurse Program (Signed)
Theresa Manning is coming in wanting assistance filling out the [employer] financial assistance program. States she was recently admitted to the hospital due to migraines that have been on going for the past couple of months after her Pfizer vaccine which was administered 05/31/19. She states she was referred to a neurologist at [facility] clinic.

## 2019-09-11 NOTE — Congregational Nurse Program (Signed)
Patient wanting assistance to fill out [employer] application. Was filled and completed with the patient present.

## 2019-11-13 ENCOUNTER — Encounter: Payer: Self-pay | Admitting: Family Medicine

## 2019-11-28 ENCOUNTER — Other Ambulatory Visit: Payer: Self-pay

## 2019-11-28 ENCOUNTER — Telehealth: Payer: Self-pay

## 2019-11-28 NOTE — Telephone Encounter (Signed)
Patient came in to her screening colonoscopy appointment and stated that she wanted to apply for the patient assistance first before scheduling her colonoscopy since she is self-pay. The application was provided to her and she stated that once she knew that she was accepted or denied, she would give me a call to let me know so she could schedule her colonoscopy. Patient had no further questions.

## 2020-06-24 ENCOUNTER — Other Ambulatory Visit: Payer: Self-pay

## 2020-06-24 ENCOUNTER — Emergency Department
Admission: EM | Admit: 2020-06-24 | Discharge: 2020-06-24 | Disposition: A | Discharge status: Home / Self Care | Payer: Self-pay | Attending: Emergency Medicine | Admitting: Emergency Medicine

## 2020-06-24 ENCOUNTER — Emergency Department: Payer: Self-pay

## 2020-06-24 DIAGNOSIS — R519 Headache, unspecified: Secondary | ICD-10-CM | POA: Insufficient documentation

## 2020-06-24 DIAGNOSIS — Z79899 Other long term (current) drug therapy: Secondary | ICD-10-CM | POA: Insufficient documentation

## 2020-06-24 MED ORDER — ACETAMINOPHEN 325 MG PO TABS
650.0000 mg | ORAL_TABLET | Freq: Once | ORAL | Status: AC
  Administered 2020-06-24: 650 mg via ORAL
  Filled 2020-06-24: qty 2

## 2020-06-24 MED ORDER — AMOXICILLIN-POT CLAVULANATE 875-125 MG PO TABS: 1.0000 | ORAL_TABLET | Freq: Two times a day (BID) | ORAL | 0 refills | Status: AC

## 2020-06-24 NOTE — Discharge Instructions (Signed)
Take Augmentin twice daily for ten days.  

## 2020-06-24 NOTE — ED Notes (Signed)
Patient transported to CT 

## 2020-06-24 NOTE — ED Provider Notes (Signed)
[facility]-EMERGENCY DEPARTMENT  ____________________________________________  Time seen: Approximately 9:53 PM  I have reviewed the triage vital signs and the nursing notes.   HISTORY  Chief Complaint Eye Pain   Historian Patient     HPI Theresa Manning is a 50 y.o. female presents to the emergency department with bilateral eye discomfort for the past 2 days.  Patient has noticed some twitching of her right lower eyelid.  She also has frontal and maxillary sinus tenderness and nasal congestion.  She also reports that her teeth hurt.  She denies numbness or tingling in the upper and lower extremities or weakness in the upper and lower extremities.  No vertigo, disorientation or confusion.  She reports that she has not originally from [place].  Denies knowledge of seasonal allergies.  No chest pain, chest tightness or abdominal pain.   History reviewed. No pertinent past medical history.   Immunizations up to date:  Yes.     History reviewed. No pertinent past medical history.  There are no problems to display for this patient.   Past Surgical History:  Procedure Laterality Date  . CHOLECYSTECTOMY      Prior to Admission medications   Medication Sig Start Date End Date Taking? Authorizing Provider  amoxicillin-clavulanate (AUGMENTIN) 875-125 MG tablet Take 1 tablet by mouth 2 (two) times daily for 10 days. 06/24/20 07/04/20 Yes Majel Giel M, PA-C  Butalbital-APAP-Caff-Cod 50-300-40-30 MG CAPS Take 1 capsule by mouth every 4 (four) hours.    [provider]  cephALEXin (KEFLEX) 500 MG capsule Take 1 capsule (500 mg total) by mouth 3 (three) times daily. 06/01/19   Irean Hong, MD  diphenhydrAMINE (BENADRYL) 25 mg capsule Take 1 capsule by mouth every 6 (six) hours as needed.    [provider]  ketorolac (TORADOL) 10 MG tablet Take 1 tablet (10 mg total) by mouth every 8 (eight) hours as needed for moderate pain (with food). 07/07/15   Rockne Menghini, MD  metoCLOPramide (REGLAN) 5 MG tablet Take 1 tablet (5 mg total) by mouth every 8 (eight) hours as needed for nausea or vomiting. 07/07/15 11/28/19  Rockne Menghini, MD  naproxen (NAPROSYN) 500 MG tablet Take 500 mg by mouth 2 (two) times daily with a meal.    [provider]  topiramate (TOPAMAX) 100 MG tablet Take 1 tablet by mouth in the morning and at bedtime.    [provider]    Allergies Patient has no known allergies.  History reviewed. No pertinent family history.  Social History Social History   Tobacco Use  . Smoking status: Never Smoker  . Smokeless tobacco: Never Used  Substance Use Topics  . Alcohol use: No     Review of Systems  Constitutional: No fever/chills Eyes: Patient has bilateral eye discomfort.  ENT: No upper respiratory complaints. Respiratory: no cough. No SOB/ use of accessory muscles to breath Gastrointestinal:   No nausea, no vomiting.  No diarrhea.  No constipation. Musculoskeletal: Negative for musculoskeletal pain. Skin: Negative for rash, abrasions, lacerations, ecchymosis.   ____________________________________________   PHYSICAL EXAM:  VITAL SIGNS: ED Triage Vitals  Enc Vitals Group     BP 06/24/20 1934 131/67     Pulse Rate 06/24/20 1934 62     Resp 06/24/20 1934 16     Temp 06/24/20 1934 98.1 F (36.7 C)     Temp Source 06/24/20 1934 Oral     SpO2 06/24/20 1934 98 %     Weight 06/24/20 1940 190  lb (86.2 kg)     Height 06/24/20 1940 5\' 7"  (1.702 m)     Head Circumference --      Peak Flow --      Pain Score 06/24/20 1940 10     Pain Loc --      Pain Edu? --      Excl. in GC? --      Constitutional: Alert and oriented. Well appearing and in no acute distress. Eyes: Conjunctivae are normal. PERRL. EOMI. Head: Atraumatic.  Patient has frontal and maxillary sinus tenderness to palpation. ENT:      Nose: No congestion/rhinnorhea.      Mouth/Throat: Mucous membranes are moist.  Neck:  No stridor.  No cervical spine tenderness to palpation. Cardiovascular: Normal rate, regular rhythm. Normal S1 and S2.  Good peripheral circulation. Respiratory: Normal respiratory effort without tachypnea or retractions. Lungs CTAB. Good air entry to the bases with no decreased or absent breath sounds Gastrointestinal: Bowel sounds x 4 quadrants. Soft and nontender to palpation. No guarding or rigidity. No distention. Musculoskeletal: Full range of motion to all extremities. No obvious deformities noted Neurologic:  Normal for age. No gross focal neurologic deficits are appreciated.  Skin:  Skin is warm, dry and intact. No rash noted. Psychiatric: Mood and affect are normal for age. Speech and behavior are normal.   ____________________________________________   LABS (all labs ordered are listed, but only abnormal results are displayed)  Labs Reviewed - No data to display ____________________________________________  EKG   ____________________________________________  RADIOLOGY 08/24/20, personally viewed and evaluated these images (plain radiographs) as part of my medical decision making, as well as reviewing the written report by the radiologist.    CT Head Wo Contrast  Result Date: 06/24/2020 CLINICAL DATA:  Bilateral eye pain worse in right eye for 2 days, visual disturbance EXAM: CT HEAD WITHOUT CONTRAST TECHNIQUE: Contiguous axial images were obtained from the base of the skull through the vertex without intravenous contrast. COMPARISON:  07/08/2015 FINDINGS: Brain: No acute infarct or hemorrhage. Lateral ventricles and midline structures are unremarkable. There are no acute extra-axial fluid collections. There is no mass effect. Vascular: No hyperdense vessel or unexpected calcification. Skull: Normal. Negative for fracture or focal lesion. Sinuses/Orbits: No acute finding. Other: None. IMPRESSION: 1. Stable head CT, no acute intracranial process. Electronically Signed    By: 07/10/2015 M.D.   On: 06/24/2020 22:19    ____________________________________________    PROCEDURES  Procedure(s) performed:     Procedures     Medications  acetaminophen (TYLENOL) tablet 650 mg (650 mg Oral Given 06/24/20 2322)     ____________________________________________   INITIAL IMPRESSION / ASSESSMENT AND PLAN / ED COURSE  Pertinent labs & imaging results that were available during my care of the patient were reviewed by me and considered in my medical decision making (see chart for details).  Clinical Course as of 06/25/20 0016  Thu Jun 24, 2020  2114 BP: 131/67 [JW]    Clinical Course User Index [JW] Jun 26, 2020, PA-C     Assessment and Plan: Facial pain Headache Eye discomfort.  50 year old female presents to the emergency department with headache, facial discomfort, teeth discomfort and nasal congestion for the past 2 to 3 days.  Vital signs are reassuring at triage.  On physical exam, patient no neurodeficits.  CT head showed no acute abnormality.  Sinusitis is likely at this time.  We will treat with Augmentin twice daily for the next 10 days.  Return precautions were given to return with new or worsening symptoms.    ____________________________________________  FINAL CLINICAL IMPRESSION(S) / ED DIAGNOSES  Final diagnoses:  Acute nonintractable headache, unspecified headache type      NEW MEDICATIONS STARTED DURING THIS VISIT:  ED Discharge Orders         Ordered    amoxicillin-clavulanate (AUGMENTIN) 875-125 MG tablet  2 times daily        06/24/20 2253              This chart was dictated using voice recognition software/Dragon. Despite best efforts to proofread, errors can occur which can change the meaning. Any change was purely unintentional.     Orvil Feil, PA-C 06/25/20 0017    Minna Antis, MD 06/28/20 [phone]

## 2020-06-24 NOTE — ED Triage Notes (Signed)
Pt reports bilateral eye pain worse in the right eye x2 days. Pt reports visual disturbances in the R eye with R eye twitching. No visual changes on the left side. Denies discharge/foreign body. Pt also reports associated headaches. Denies trouble with balance/coordination. No unilateral weakness noted or facial asymmetry.

## 2020-10-25 ENCOUNTER — Other Ambulatory Visit: Payer: Self-pay

## 2020-10-25 ENCOUNTER — Emergency Department
Admission: EM | Admit: 2020-10-25 | Discharge: 2020-10-25 | Disposition: A | Discharge status: Home / Self Care | Payer: Self-pay | Attending: Student in an Organized Health Care Education/Training Program | Admitting: Student in an Organized Health Care Education/Training Program

## 2020-10-25 ENCOUNTER — Emergency Department: Payer: Self-pay

## 2020-10-25 DIAGNOSIS — R109 Unspecified abdominal pain: Secondary | ICD-10-CM | POA: Insufficient documentation

## 2020-10-25 DIAGNOSIS — R519 Headache, unspecified: Secondary | ICD-10-CM | POA: Insufficient documentation

## 2020-10-25 DIAGNOSIS — R079 Chest pain, unspecified: Secondary | ICD-10-CM

## 2020-10-25 DIAGNOSIS — Z20822 Contact with and (suspected) exposure to covid-19: Secondary | ICD-10-CM | POA: Insufficient documentation

## 2020-10-25 DIAGNOSIS — R0789 Other chest pain: Secondary | ICD-10-CM | POA: Insufficient documentation

## 2020-10-25 DIAGNOSIS — R0602 Shortness of breath: Secondary | ICD-10-CM | POA: Insufficient documentation

## 2020-10-25 LAB — TROPONIN I (HIGH SENSITIVITY)
Troponin I (High Sensitivity): 3 ng/L (ref <18)
Troponin I (High Sensitivity): 3 ng/L (ref <18)

## 2020-10-25 LAB — URINALYSIS, COMPLETE (UACMP) WITH MICROSCOPIC
Bacteria, UA: NONE SEEN
Bilirubin Urine: NEGATIVE
Glucose, UA: NEGATIVE mg/dL
Ketones, ur: NEGATIVE mg/dL
Leukocytes,Ua: NEGATIVE
Nitrite: NEGATIVE
Protein, ur: NEGATIVE mg/dL
Specific Gravity, Urine: 1.005 (ref 1.005–1.030)
pH: 6 (ref 5.0–8.0)

## 2020-10-25 LAB — CBC
HCT: 40.9 % (ref 36.0–46.0)
Hemoglobin: 13.7 g/dL (ref 12.0–15.0)
MCH: 29.8 pg (ref 26.0–34.0)
MCHC: 33.5 g/dL (ref 30.0–36.0)
MCV: 88.9 fL (ref 80.0–100.0)
Platelets: 296 10*3/uL (ref 150–400)
RBC: 4.6 MIL/uL (ref 3.87–5.11)
RDW: 13.1 % (ref 11.5–15.5)
WBC: 7.3 10*3/uL (ref 4.0–10.5)
nRBC: 0 % (ref 0.0–0.2)

## 2020-10-25 LAB — RESP PANEL BY RT-PCR (FLU A&B, COVID) ARPGX2
Influenza A by PCR: NEGATIVE
Influenza B by PCR: NEGATIVE
SARS Coronavirus 2 by RT PCR: NEGATIVE

## 2020-10-25 LAB — BASIC METABOLIC PANEL
Anion gap: 3 — ABNORMAL LOW (ref 5–15)
BUN: 9 mg/dL (ref 6–20)
CO2: 27 mmol/L (ref 22–32)
Calcium: 8.9 mg/dL (ref 8.9–10.3)
Chloride: 109 mmol/L (ref 98–111)
Creatinine, Ser: 0.56 mg/dL (ref 0.44–1.00)
GFR, Estimated: >60 mL/min (ref >60)
Glucose, Bld: 96 mg/dL (ref 70–99)
Potassium: 3.6 mmol/L (ref 3.5–5.1)
Sodium: 139 mmol/L (ref 135–145)

## 2020-10-25 MED ORDER — OXYCODONE-ACETAMINOPHEN 5-325 MG PO TABS
1.0000 | ORAL_TABLET | Freq: Once | ORAL | Status: AC
  Administered 2020-10-25: 1 via ORAL
  Filled 2020-10-25: qty 1

## 2020-10-25 MED ORDER — IOHEXOL 350 MG/ML SOLN
75.0000 mL | Freq: Once | INTRAVENOUS | Status: AC | PRN
  Administered 2020-10-25: 75 mL via INTRAVENOUS
  Filled 2020-10-25: qty 75

## 2020-10-25 MED ORDER — KETOROLAC TROMETHAMINE 30 MG/ML IJ SOLN
15.0000 mg | Freq: Once | INTRAMUSCULAR | Status: AC
  Administered 2020-10-25: 15 mg via INTRAVENOUS
  Filled 2020-10-25: qty 1

## 2020-10-25 NOTE — ED Triage Notes (Signed)
Interpreter maritza used for triage.  Pt to ED for shob, chest pain, body aches for a week. Pt in NAD

## 2020-10-25 NOTE — ED Provider Notes (Signed)
[facility] Emergency Department Provider Note    Event Date/Time   First MD Initiated Contact with Patient 10/25/20 1250     (approximate)  I have reviewed the triage vital signs and the nursing notes.   HISTORY  Chief Complaint Shortness of Breath    HPI Theresa Manning is a 49 y.o. female below listed past medical history presents to the ER for evaluation of body aches left lower flank pain chest pain some cough and shortness of breath as well as headache.   Has a history of recurrent headaches and this feels the same.  This been ongoing for about a week.  Does have this tender area on the left side of her low back that feels swollen denies any injury no overlying redness and has not noticed any rashes.  No known sick contacts.  Denies any dysuria.  No diarrhea.  History reviewed. No pertinent past medical history. No family history on file. Past Surgical History:  Procedure Laterality Date   CHOLECYSTECTOMY     There are no problems to display for this patient.     Prior to Admission medications   Medication Sig Start Date End Date Taking? Authorizing Provider  Butalbital-APAP-Caff-Cod 50-300-40-30 MG CAPS Take 1 capsule by mouth every 4 (four) hours.    [provider]  cephALEXin (KEFLEX) 500 MG capsule Take 1 capsule (500 mg total) by mouth 3 (three) times daily. 06/01/19   Irean Hong, MD  diphenhydrAMINE (BENADRYL) 25 mg capsule Take 1 capsule by mouth every 6 (six) hours as needed.    [provider]  ketorolac (TORADOL) 10 MG tablet Take 1 tablet (10 mg total) by mouth every 8 (eight) hours as needed for moderate pain (with food). 07/07/15   Rockne Menghini, MD  metoCLOPramide (REGLAN) 5 MG tablet Take 1 tablet (5 mg total) by mouth every 8 (eight) hours as needed for nausea or vomiting. 07/07/15 11/28/19  Rockne Menghini, MD  naproxen (NAPROSYN) 500 MG tablet Take 500 mg by mouth 2 (two) times daily with a meal.     [provider]  topiramate (TOPAMAX) 100 MG tablet Take 1 tablet by mouth in the morning and at bedtime.    [provider]    Allergies Patient has no known allergies.    Social History Social History   Tobacco Use   Smoking status: Never   Smokeless tobacco: Never  Substance Use Topics   Alcohol use: No   Drug use: Never    Review of Systems Patient denies headaches, rhinorrhea, blurry vision, numbness, shortness of breath, chest pain, edema, cough, abdominal pain, nausea, vomiting, diarrhea, dysuria, fevers, rashes or hallucinations unless otherwise stated above in HPI. ____________________________________________   PHYSICAL EXAM:  VITAL SIGNS: Vitals:   10/25/20 1242 10/25/20 1540  BP: 131/86 (!) 148/84  Pulse: 69 (!) 52  Resp: 18 18  Temp: 99.1 F (37.3 C)   SpO2: 97% 95%    Constitutional: Alert and oriented.  Eyes: Conjunctivae are normal.  Head: Atraumatic. Nose: No congestion/rhinnorhea. Mouth/Throat: Mucous membranes are moist.   Neck: No stridor. Painless ROM.  Cardiovascular: Normal rate, regular rhythm. Grossly normal heart sounds.  Good peripheral circulation. Respiratory: Normal respiratory effort.  No retractions. Lungs CTAB. Gastrointestinal: Soft and nontender. No distention. No abdominal bruits. No CVA tenderness. Genitourinary:  Musculoskeletal: No lower extremity tenderness nor edema.  No joint effusions. Neurologic:  Normal speech and language. No gross focal neurologic deficits are appreciated. No facial droop  Skin:  Skin is warm, dry and intact. No rash noted. Psychiatric: Mood and affect are normal. Speech and behavior are normal.  ____________________________________________   LABS (all labs ordered are listed, but only abnormal results are displayed)  Results for orders placed or performed during the hospital encounter of 10/25/20 (from the past 24 hour(s))  Basic metabolic panel     Status: Abnormal    Collection Time: 10/25/20 12:41 PM  Result Value Ref Range   Sodium 139 135 - 145 mmol/L   Potassium 3.6 3.5 - 5.1 mmol/L   Chloride 109 98 - 111 mmol/L   CO2 27 22 - 32 mmol/L   Glucose, Bld 96 70 - 99 mg/dL   BUN 9 6 - 20 mg/dL   Creatinine, Ser 4.09 0.44 - 1.00 mg/dL   Calcium 8.9 8.9 - 81.1 mg/dL   GFR, Estimated >91 >47 mL/min   Anion gap 3 (L) 5 - 15  CBC     Status: None   Collection Time: 10/25/20 12:41 PM  Result Value Ref Range   WBC 7.3 4.0 - 10.5 K/uL   RBC 4.60 3.87 - 5.11 MIL/uL   Hemoglobin 13.7 12.0 - 15.0 g/dL   HCT 82.9 56.2 - 13.0 %   MCV 88.9 80.0 - 100.0 fL   MCH 29.8 26.0 - 34.0 pg   MCHC 33.5 30.0 - 36.0 g/dL   RDW 86.5 78.4 - 69.6 %   Platelets 296 150 - 400 K/uL   nRBC 0.0 0.0 - 0.2 %  Troponin I (High Sensitivity)     Status: None   Collection Time: 10/25/20 12:41 PM  Result Value Ref Range   Troponin I (High Sensitivity) 3 <18 ng/L  Resp Panel by RT-PCR (Flu A&B, Covid) Nasopharyngeal Swab     Status: None   Collection Time: 10/25/20  1:00 PM   Specimen: Nasopharyngeal Swab; Nasopharyngeal(NP) swabs in vial transport medium  Result Value Ref Range   SARS Coronavirus 2 by RT PCR NEGATIVE NEGATIVE   Influenza A by PCR NEGATIVE NEGATIVE   Influenza B by PCR NEGATIVE NEGATIVE  Urinalysis, Complete w Microscopic     Status: Abnormal   Collection Time: 10/25/20  3:33 PM  Result Value Ref Range   Color, Urine YELLOW (A) YELLOW   APPearance CLOUDY (A) CLEAR   Specific Gravity, Urine 1.005 1.005 - 1.030   pH 6.0 5.0 - 8.0   Glucose, UA NEGATIVE NEGATIVE mg/dL   Hgb urine dipstick TRACE (A) NEGATIVE   Bilirubin Urine NEGATIVE NEGATIVE   Ketones, ur NEGATIVE NEGATIVE mg/dL   Protein, ur NEGATIVE NEGATIVE mg/dL   Nitrite NEGATIVE NEGATIVE   Leukocytes,Ua NEGATIVE NEGATIVE   RBC / HPF 0-5 0 - 5 RBC/hpf   WBC, UA 0-5 0 - 5 WBC/hpf   Bacteria, UA NONE SEEN NONE SEEN   Squamous Epithelial / LPF 11-20 0 - 5   Mucus PRESENT   Troponin I (High  Sensitivity)     Status: None   Collection Time: 10/25/20  3:33 PM  Result Value Ref Range   Troponin I (High Sensitivity) 3 <18 ng/L   ____________________________________________  EKG My review and personal interpretation at Time: 12:40   Indication: chest pain  Rate: 70  Rhythm: sinus Axis: normal Other: normal intervals, no stemi ____________________________________________  RADIOLOGY  I personally reviewed all radiographic images ordered to evaluate for the above acute complaints and reviewed radiology reports and findings.  These findings were personally discussed with the patient.  Please see  medical record for radiology report.  ____________________________________________   PROCEDURES  Procedure(s) performed:  Procedures    Critical Care performed: no ____________________________________________   INITIAL IMPRESSION / ASSESSMENT AND PLAN / ED COURSE  Pertinent labs & imaging results that were available during my care of the patient were reviewed by me and considered in my medical decision making (see chart for details).   DDX: URI, COVID, pneumonia, tension headache, migraine, mass, colitis, diverticulitis, stone, pyelonephritis  Theresa Manning is a 50 y.o. who presents to the ED with presentation as described above.  Patient nontoxic-appearing.  Will give pain medication blood will be sent with above differential.  EKG is nonischemic.  Possible COVID or viral illness.  Neuro exam is reassuring.  Do not feel that CT imaging of brain clinically indicated history of recurrent headaches and is not the worst headache of her life does not seem consistent with subarachnoid.  No meningismus on exam.  Seems her primary concern is this area of tenderness and swelling in the left low back and upper buttock that feels somewhat consistent with a lipoma no overlying cellulitis.  Given her complaint and pain will order CT imaging to evaluate for mass or intra-abdominal processes  exam somewhat limited due to the patient's obesity.  Clinical Course as of 10/25/20 1639  Mon Oct 25, 2020  1636 Patient's work-up here in the ER is reassuring.  She does appear stable and appropriate for outpatient follow-up.  She feels improved after symptomatic management here.  Not consistent with ACS.  CT imaging reassuring without acute abnormality.  Does appear stable and appropriate for discharge home. [PR]    Clinical Course User Index [PR] Willy Eddy, MD    The patient was evaluated in Emergency Department today for the symptoms described in the history of present illness. He/she was evaluated in the context of the global COVID-19 pandemic, which necessitated consideration that the patient might be at risk for infection with the SARS-CoV-2 virus that causes COVID-19. Institutional protocols and algorithms that pertain to the evaluation of patients at risk for COVID-19 are in a state of rapid change based on information released by regulatory bodies including the CDC and federal and state organizations. These policies and algorithms were followed during the patient's care in the ED.  As part of my medical decision making, I reviewed the following data within the electronic MEDICAL RECORD NUMBER Nursing notes reviewed and incorporated, Labs reviewed, notes from prior ED visits and [place] Controlled Substance Database   ____________________________________________   FINAL CLINICAL IMPRESSION(S) / ED DIAGNOSES  Final diagnoses:  Flank pain  Nonintractable headache, unspecified chronicity pattern, unspecified headache type  Chest pain, unspecified type      NEW MEDICATIONS STARTED DURING THIS VISIT:  New Prescriptions   No medications on file     Note:  This document was prepared using Dragon voice recognition software and may include unintentional dictation errors.    Willy Eddy, MD 10/25/20 [phone]

## 2021-07-14 ENCOUNTER — Other Ambulatory Visit: Payer: Self-pay

## 2021-07-14 ENCOUNTER — Encounter: Payer: Self-pay | Admitting: *Deleted

## 2021-07-14 ENCOUNTER — Emergency Department
Admission: EM | Admit: 2021-07-14 | Discharge: 2021-07-14 | Disposition: A | Discharge status: Home / Self Care | Payer: Self-pay | Attending: Emergency Medicine | Admitting: Emergency Medicine

## 2021-07-14 ENCOUNTER — Emergency Department: Payer: Self-pay

## 2021-07-14 DIAGNOSIS — G43901 Migraine, unspecified, not intractable, with status migrainosus: Secondary | ICD-10-CM | POA: Insufficient documentation

## 2021-07-14 DIAGNOSIS — R42 Dizziness and giddiness: Secondary | ICD-10-CM | POA: Insufficient documentation

## 2021-07-14 LAB — BASIC METABOLIC PANEL
Anion gap: 5 (ref 5–15)
BUN: 9 mg/dL (ref 6–20)
CO2: 25 mmol/L (ref 22–32)
Calcium: 9.3 mg/dL (ref 8.9–10.3)
Chloride: 106 mmol/L (ref 98–111)
Creatinine, Ser: 0.58 mg/dL (ref 0.44–1.00)
GFR, Estimated: >60 mL/min (ref >60)
Glucose, Bld: 100 mg/dL — ABNORMAL HIGH (ref 70–99)
Potassium: 3.4 mmol/L — ABNORMAL LOW (ref 3.5–5.1)
Sodium: 136 mmol/L (ref 135–145)

## 2021-07-14 LAB — CBC WITH DIFFERENTIAL/PLATELET
Abs Immature Granulocytes: 0.06 10*3/uL (ref 0.00–0.07)
Basophils Absolute: 0.1 10*3/uL (ref 0.0–0.1)
Basophils Relative: 1 %
Eosinophils Absolute: 0.2 10*3/uL (ref 0.0–0.5)
Eosinophils Relative: 2 %
HCT: 38.3 % (ref 36.0–46.0)
Hemoglobin: 12.1 g/dL (ref 12.0–15.0)
Immature Granulocytes: 1 %
Lymphocytes Relative: 29 %
Lymphs Abs: 3.4 10*3/uL (ref 0.7–4.0)
MCH: 28.9 pg (ref 26.0–34.0)
MCHC: 31.6 g/dL (ref 30.0–36.0)
MCV: 91.6 fL (ref 80.0–100.0)
Monocytes Absolute: 0.7 10*3/uL (ref 0.1–1.0)
Monocytes Relative: 6 %
Neutro Abs: 7.4 10*3/uL (ref 1.7–7.7)
Neutrophils Relative %: 61 %
Platelets: 329 10*3/uL (ref 150–400)
RBC: 4.18 MIL/uL (ref 3.87–5.11)
RDW: 14.1 % (ref 11.5–15.5)
WBC: 11.8 10*3/uL — ABNORMAL HIGH (ref 4.0–10.5)
nRBC: 0 % (ref 0.0–0.2)

## 2021-07-14 MED ORDER — SODIUM CHLORIDE 0.9 % IV BOLUS
1000.0000 mL | Freq: Once | INTRAVENOUS | Status: AC
  Administered 2021-07-14: 1000 mL via INTRAVENOUS

## 2021-07-14 MED ORDER — METOCLOPRAMIDE HCL 5 MG/ML IJ SOLN
10.0000 mg | INTRAMUSCULAR | Status: AC
  Administered 2021-07-14: 10 mg via INTRAVENOUS
  Filled 2021-07-14: qty 2

## 2021-07-14 MED ORDER — IOHEXOL 350 MG/ML SOLN
75.0000 mL | Freq: Once | INTRAVENOUS | Status: AC | PRN
  Administered 2021-07-14: 75 mL via INTRAVENOUS

## 2021-07-14 MED ORDER — DIPHENHYDRAMINE HCL 25 MG PO CAPS: 25.0000 mg | ORAL_CAPSULE | Freq: Four times a day (QID) | ORAL | 0 refills | Status: AC | PRN

## 2021-07-14 MED ORDER — KETOROLAC TROMETHAMINE 10 MG PO TABS: 10.0000 mg | ORAL_TABLET | Freq: Three times a day (TID) | ORAL | 0 refills | Status: AC | PRN

## 2021-07-14 MED ORDER — DIPHENHYDRAMINE HCL 50 MG/ML IJ SOLN
50.0000 mg | Freq: Once | INTRAMUSCULAR | Status: AC
  Administered 2021-07-14: 50 mg via INTRAVENOUS
  Filled 2021-07-14: qty 1

## 2021-07-14 MED ORDER — KETOROLAC TROMETHAMINE 15 MG/ML IJ SOLN
15.0000 mg | Freq: Once | INTRAMUSCULAR | Status: AC
  Administered 2021-07-14: 15 mg via INTRAVENOUS
  Filled 2021-07-14: qty 1

## 2021-07-14 MED ORDER — METOCLOPRAMIDE HCL 10 MG PO TABS: 10.0000 mg | ORAL_TABLET | Freq: Four times a day (QID) | ORAL | 0 refills | Status: AC | PRN

## 2021-07-14 NOTE — ED Triage Notes (Addendum)
Pt ambulatory to triage.  Pt reports she feels dizzy.  Pt reports h/a.   Pt reports nausea.  Pt also has tingling in hands.  Pt took otc meds with some relief.  Pt alert  speech clear.  Interpreter on a stick used in triage.

## 2021-07-14 NOTE — Discharge Instructions (Signed)
Your lab test and CT scan of the head are all okay today.  Continue taking your medications at home to help control migraine symptoms and follow-up with your doctor.

## 2021-07-14 NOTE — ED Notes (Signed)
Patient transported to CT 

## 2021-07-14 NOTE — ED Provider Notes (Signed)
[facility] Provider Note    Event Date/Time   First MD Initiated Contact with Patient 07/14/21 1925     (approximate)   History   Chief Complaint: Dizziness   HPI  Encounter completed with Spanish video interpreter  Theresa Manning is a 51 y.o. female with a past history of migraine headaches who comes ED complaining of headache in the bilateral frontal and bilateral occipital head which has been ongoing since waking up at her usual time this morning.  Onset was rapid and severe.  Associated with bilateral hand and perioral tingling.  Denies feeling anxious.  She does have some nausea but no vomiting.  Try taking her oral migraine medicines without relief.  Denies any fever or neck stiffness, no recent trauma.  No vision disturbance.     Physical Exam   Triage Vital Signs: ED Triage Vitals  Enc Vitals Group     BP 07/14/21 1912 (!) 162/91     Pulse Rate 07/14/21 1912 69     Resp 07/14/21 1912 20     Temp 07/14/21 1912 98.4 F (36.9 C)     Temp Source 07/14/21 1912 Oral     SpO2 07/14/21 1912 99 %     Weight 07/14/21 1916 200 lb (90.7 kg)     Height 07/14/21 1916 5\' 6"  (1.676 m)     Head Circumference --      Peak Flow --      Pain Score 07/14/21 1915 10     Pain Loc --      Pain Edu? --      Excl. in GC? --     Most recent vital signs: Vitals:   07/14/21 2030 07/14/21 2130  BP: (!) 109/92 (!) 111/59  Pulse: 68 77  Resp: 18 18  Temp:    SpO2: 99% 99%    General: Awake, no distress.  CV:  Good peripheral perfusion.  Regular rate rhythm Resp:  Normal effort.  Clear to bilaterally Abd:  No distention.  Soft and nontender Other:  PERRL, EOMI, no nystagmus.  She does have photophobia on exam.   ED Results / Procedures / Treatments   Labs (all labs ordered are listed, but only abnormal results are displayed) Labs Reviewed  BASIC METABOLIC PANEL - Abnormal; Notable for the following components:      Result Value   Potassium 3.4  (*)    Glucose, Bld 100 (*)    All other components within normal limits  CBC WITH DIFFERENTIAL/PLATELET - Abnormal; Notable for the following components:   WBC 11.8 (*)    All other components within normal limits     EKG    RADIOLOGY CTA head/neck viewed and interpreted by me, no ICH or mass.  Radiology report reviewed.   PROCEDURES:  Procedures   MEDICATIONS ORDERED IN ED: Medications  sodium chloride 0.9 % bolus 1,000 mL (0 mLs Intravenous Stopped 07/14/21 2218)  ketorolac (TORADOL) 15 MG/ML injection 15 mg (15 mg Intravenous Given 07/14/21 2026)  metoCLOPramide (REGLAN) injection 10 mg (10 mg Intravenous Given 07/14/21 2026)  diphenhydrAMINE (BENADRYL) injection 50 mg (50 mg Intravenous Given 07/14/21 2026)  iohexol (OMNIPAQUE) 350 MG/ML injection 75 mL (75 mLs Intravenous Contrast Given 07/14/21 2058)     IMPRESSION / MDM / ASSESSMENT AND PLAN / ED COURSE  I reviewed the triage vital signs and the nursing notes.  Differential diagnosis includes, but is not limited to, migraine headache, cerebral aneurysm, vertebral basilar occlusion/dissection, ICH  Patient's presentation is most consistent with acute presentation with potential threat to life or bodily function.  Patient presents with rapid onset of headache with dizziness and paresthesias.  CT angiogram obtained to evaluate for vascular cause which is negative.  Vital signs unremarkable, symptoms improved with migraine cocktail.  Not requiring admission and stable for discharge home.       FINAL CLINICAL IMPRESSION(S) / ED DIAGNOSES   Final diagnoses:  Migraine with status migrainosus, not intractable, unspecified migraine type     Rx / DC Orders   ED Discharge Orders     None        Note:  This document was prepared using Dragon voice recognition software and may include unintentional dictation errors.   Sharman Cheek, MD 07/14/21 2230

## 2022-01-05 ENCOUNTER — Other Ambulatory Visit: Payer: Self-pay

## 2022-01-05 ENCOUNTER — Emergency Department
Admission: EM | Admit: 2022-01-05 | Discharge: 2022-01-05 | Disposition: A | Discharge status: Home / Self Care | Payer: Self-pay | Attending: Emergency Medicine | Admitting: Emergency Medicine

## 2022-01-05 DIAGNOSIS — X58XXXA Exposure to other specified factors, initial encounter: Secondary | ICD-10-CM | POA: Insufficient documentation

## 2022-01-05 DIAGNOSIS — T162XXA Foreign body in left ear, initial encounter: Secondary | ICD-10-CM | POA: Insufficient documentation

## 2022-01-05 MED ORDER — IBUPROFEN 800 MG PO TABS
800.0000 mg | ORAL_TABLET | Freq: Once | ORAL | Status: AC
  Administered 2022-01-05: 800 mg via ORAL
  Filled 2022-01-05: qty 1

## 2022-01-05 NOTE — ED Triage Notes (Signed)
Patient ambulatory to triage with steady gait, without difficulty or distress noted; pt awoke with cockroach in left ear

## 2022-01-05 NOTE — Discharge Instructions (Addendum)
You may alternate Tylenol 1000 mg every 6 hours as needed for pain, fever and Ibuprofen 800 mg every 6-8 hours as needed for pain, fever.  Please take Ibuprofen with food.  Do not take more than 4000 mg of Tylenol (acetaminophen) in a 24 hour period. ° °

## 2022-01-05 NOTE — ED Provider Notes (Signed)
[facility] Provider Note    Event Date/Time   First MD Initiated Contact with Patient 01/05/22 [phone]     (approximate)   History   Foreign Body   HPI  Theresa Manning is a 51 y.o. female with no significant past medical history who presents to the emergency department with an insect in her left ear.  States it woke her from sleep.  She put rubbing alcohol on her ear and states she felt go deeper but then did not feel it moving afterwards.   History provided by patient and family.    No past medical history on file.  Past Surgical History:  Procedure Laterality Date   CHOLECYSTECTOMY      MEDICATIONS:  Prior to Admission medications   Medication Sig Start Date End Date Taking? Authorizing Provider  Butalbital-APAP-Caff-Cod 50-300-40-30 MG CAPS Take 1 capsule by mouth every 4 (four) hours.    [provider]  cephALEXin (KEFLEX) 500 MG capsule Take 1 capsule (500 mg total) by mouth 3 (three) times daily. 06/01/19   Irean Hong, MD  diphenhydrAMINE (BENADRYL) 25 mg capsule Take 1 capsule (25 mg total) by mouth every 6 (six) hours as needed. 07/14/21   Sharman Cheek, MD  metoCLOPramide (REGLAN) 10 MG tablet Take 1 tablet (10 mg total) by mouth every 6 (six) hours as needed. 07/14/21   Sharman Cheek, MD  topiramate (TOPAMAX) 100 MG tablet Take 1 tablet by mouth in the morning and at bedtime.    [provider]    Physical Exam   Triage Vital Signs: ED Triage Vitals  Enc Vitals Group     BP 01/05/22 0532 134/77     Pulse Rate 01/05/22 0532 60     Resp 01/05/22 0532 20     Temp 01/05/22 0532 97.6 F (36.4 C)     Temp Source 01/05/22 0532 Oral     SpO2 01/05/22 0532 98 %     Weight 01/05/22 0527 200 lb (90.7 kg)     Height 01/05/22 0527 5\' 3"  (1.6 m)     Head Circumference --      Peak Flow --      Pain Score --      Pain Loc --      Pain Edu? --      Excl. in GC? --     Most recent vital signs: Vitals:    01/05/22 0532  BP: 134/77  Pulse: 60  Resp: 20  Temp: 97.6 F (36.4 C)  SpO2: 98%     CONSTITUTIONAL: Alert and responds appropriately to questions. Well-appearing; well-nourished HEAD: Normocephalic, atraumatic EYES: Conjunctivae clear, pupils appear equal ENT: normal nose; moist mucous membranes, patient has an insect flush against the tympanic membrane of the left ear.  No redness, drainage, bleeding or perforation noted. NECK: Normal range of motion CARD: Regular rate and rhythm RESP: Normal chest excursion without splinting or tachypnea; no hypoxia or respiratory distress, speaking full sentences ABD/GI: non-distended EXT: Normal ROM in all joints, no major deformities noted SKIN: Normal color for age and race, no rashes on exposed skin NEURO: Moves all extremities equally, normal speech, no facial asymmetry noted PSYCH: The patient's mood and manner are appropriate. Grooming and personal hygiene are appropriate.  ED Results / Procedures / Treatments   LABS: (all labs ordered are listed, but only abnormal results are displayed) Labs Reviewed - No data to display   EKG:  RADIOLOGY: My personal review and interpretation of  imaging:    I have personally reviewed all radiology reports. No results found.   PROCEDURES:  Critical Care performed: No     .Foreign Body Removal  Date/Time: 01/05/2022 5:43 AM  Performed by: Sammie Denner, Layla Maw, DO Authorized by: Khaalid Lefkowitz, Layla Maw, DO  Consent: Verbal consent obtained. Risks and benefits: risks, benefits and alternatives were discussed Consent given by: patient Patient understanding: patient states understanding of the procedure being performed Patient consent: the patient's understanding of the procedure matches consent given Procedure consent: procedure consent matches procedure scheduled Relevant documents: relevant documents present and verified Site marked: the operative site was marked Required items: required blood  products, implants, devices, and special equipment available Patient identity confirmed: verbally with patient Time out: Immediately prior to procedure a "time out" was called to verify the correct patient, procedure, equipment, support staff and site/side marked as required. Body area: ear Location details: left ear  Sedation: Patient sedated: no  Patient restrained: no Patient cooperative: yes Localization method: ENT speculum Removal mechanism: suction and irrigation Complexity: simple 1 objects recovered. Objects recovered: insect Post-procedure assessment: residual foreign bodies remain Patient tolerance: patient tolerated the procedure well with no immediate complications Comments: One small insect leg still left up against TM      IMPRESSION / MDM / ASSESSMENT AND PLAN / ED COURSE  I reviewed the triage vital signs and the nursing notes.   Patient with insect in the left external auditory canal.     DIFFERENTIAL DIAGNOSIS (includes but not limited to):   Insect in the left external auditory canal.  No signs of TM perforation, otitis media, otitis externa.  Patient's presentation is most consistent with acute complicated illness / injury requiring diagnostic workup.  PLAN: We will irrigate ear and attempt to suction.  At this time where the insect is that I do not think I am going to be able to get it with forceps given it is flush against the tympanic membrane.  I worry that I would perforate the tympanic membrane.   MEDICATIONS GIVEN IN ED: Medications  ibuprofen (ADVIL) tablet 800 mg (has no administration in time range)     ED COURSE: We were able to successfully remove insect with suction and irrigation.  TM appears slightly erythematous but no perforation, bleeding, drainage or sign of infection.  Recommended Tylenol, Motrin.  Will give ENT follow-up if needed.  No indication for antibiotics.   At this time, I do not feel there is any life-threatening  condition present. I reviewed all nursing notes, vitals, pertinent previous records.  All lab and urine results, EKGs, imaging ordered have been independently reviewed and interpreted by myself.  I reviewed all available radiology reports from any imaging ordered this visit.  Based on my assessment, I feel the patient is safe to be discharged home without further emergent workup and can continue workup as an outpatient as needed. Discussed all findings, treatment plan as well as usual and customary return precautions.  They verbalize understanding and are comfortable with this plan.  Outpatient follow-up has been provided as needed.  All questions have been answered.    CONSULTS:  none   OUTSIDE RECORDS REVIEWED: Reviewed last office visit with Trena Platt with general surgery on 01/05/2021.     FINAL CLINICAL IMPRESSION(S) / ED DIAGNOSES   Final diagnoses:  Acute foreign body of left ear canal, initial encounter     Rx / DC Orders   ED Discharge Orders     None  Note:  This document was prepared using Dragon voice recognition software and may include unintentional dictation errors.   Haide Klinker, Layla Maw, DO 01/05/22 [phone]

## 2022-01-05 NOTE — ED Notes (Signed)
E-signature pad unavailable - Pt verbalized understanding of D/C information - no additional concerns at this time.  

## 2023-10-26 ENCOUNTER — Telehealth: Payer: Self-pay | Admitting: *Deleted

## 2023-10-26 ENCOUNTER — Encounter: Payer: Self-pay | Admitting: Nurse Practitioner

## 2023-11-19 ENCOUNTER — Other Ambulatory Visit: Payer: Self-pay | Admitting: *Deleted

## 2023-11-19 ENCOUNTER — Inpatient Hospital Stay
Admission: RE | Admit: 2023-11-19 | Discharge: 2023-11-19 | Disposition: A | Payer: Self-pay | Source: Ambulatory Visit | Attending: Nurse Practitioner | Admitting: Nurse Practitioner

## 2023-11-19 DIAGNOSIS — Z1231 Encounter for screening mammogram for malignant neoplasm of breast: Secondary | ICD-10-CM

## 2023-12-15 IMAGING — CT CT ANGIO HEAD-NECK (W OR W/O PERF)
2 of 11 series · 7 of 33 positions shown · IV contrast (APPLIED)
Comparison: CTA head 07/08/2015, no prior CTA of the neck,
correlation is also made with 06/24/2020 CT head

CLINICAL DATA: Dizziness, headache, nausea

EXAM:
CT ANGIOGRAPHY HEAD AND NECK
TECHNIQUE: Multidetector CT imaging of the head and neck was performed using
the standard protocol during bolus administration of intravenous
contrast. Multiplanar CT image reconstructions and MIPs were
obtained to evaluate the vascular anatomy. Carotid stenosis
measurements (when applicable) are obtained utilizing NASCET
criteria, using the distal internal carotid diameter as the
denominator.

[Series 8: cta head neck · axial · 0.46mm/px · z∈[-273,-153]mm · 2 of 180 slices shown]
[im 60/180  soft-tissue]
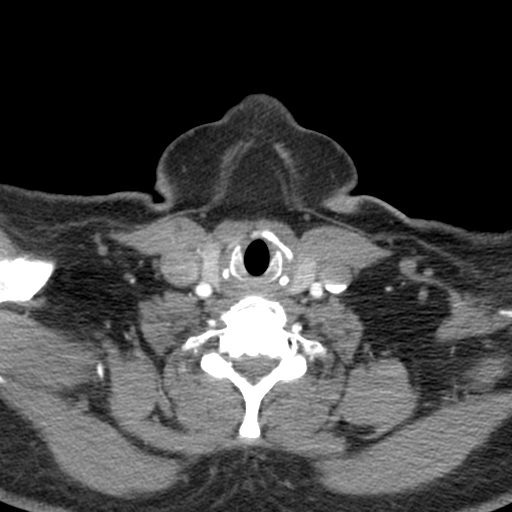
[im 120/180  soft-tissue]
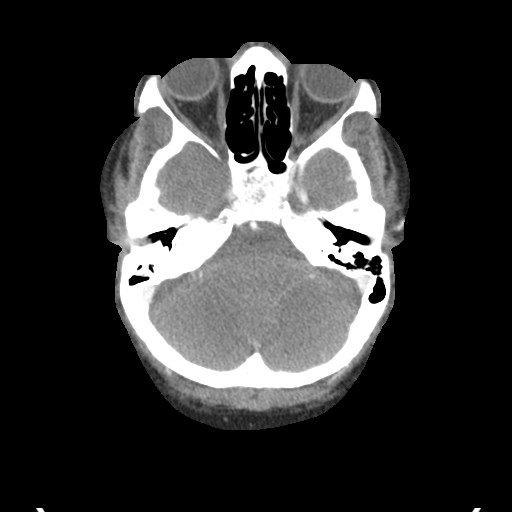

[Series 10: ax thin · axial · 0.56mm/px · z∈[-331,-91]mm · 5 of 360 slices shown]
[im 60/360  soft-tissue]
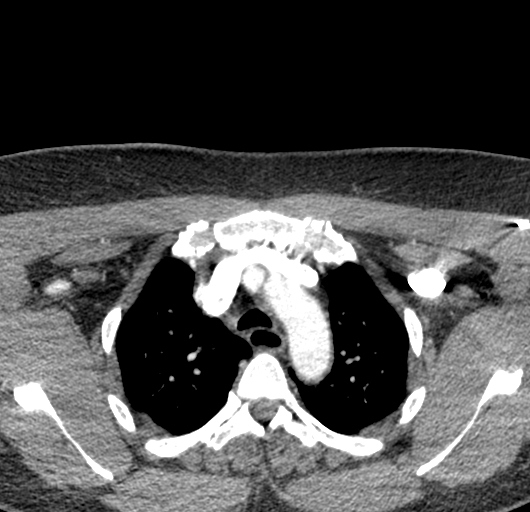
[im 120/360  bone]
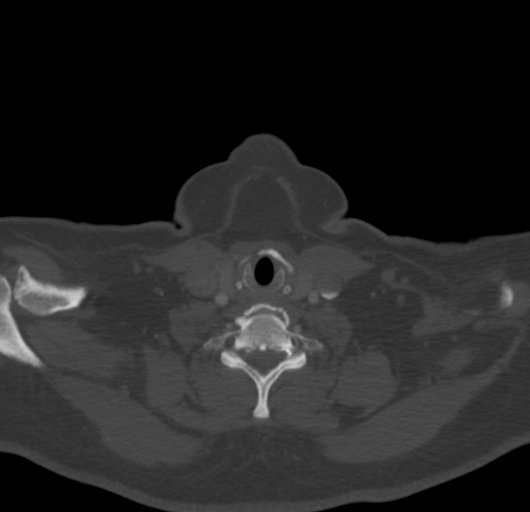
[im 180/360  soft-tissue]
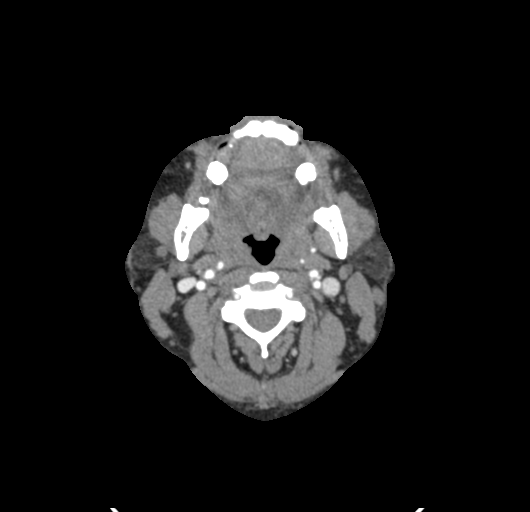
[im 240/360  bone]
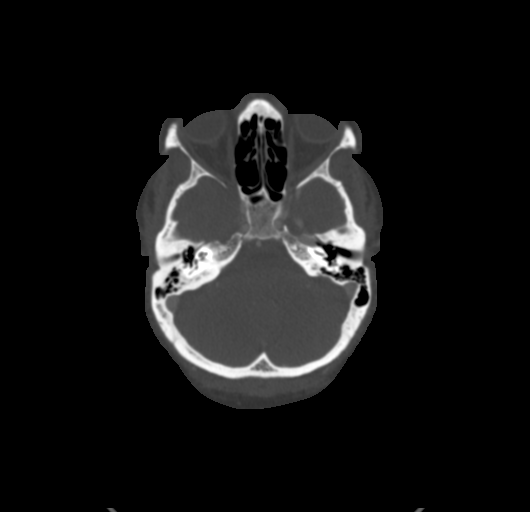
[im 300/360  soft-tissue]
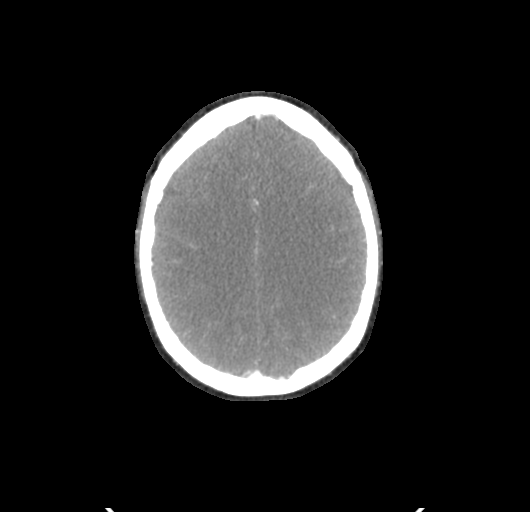

[7 of 33 positions shown; findings below may reference images not displayed]

RADIATION DOSE REDUCTION: This exam was performed according to the
departmental dose-optimization program which includes automated
exposure control, adjustment of the mA and/or kV according to
patient size and/or use of iterative reconstruction technique.

CONTRAST:  75mL OMNIPAQUE IOHEXOL 350 MG/ML SOLN
FINDINGS: CT HEAD FINDINGS

Brain: No evidence of acute infarction, hemorrhage, cerebral edema,
mass, mass effect, or midline shift. No hydrocephalus or extra-axial
fluid collection.

Vascular: No hyperdense vessel.

Skull: Normal. Negative for fracture or focal lesion.

Sinuses/Orbits: No acute finding.

Other: The mastoid air cells are well aerated.

CTA NECK FINDINGS

Aortic arch: Standard branching. Imaged portion shows no evidence of
aneurysm or dissection. No significant stenosis of the major arch
vessel origins.

Right carotid system: No evidence of dissection, occlusion, or
hemodynamically significant stenosis (greater than 50%).

Left carotid system: No evidence of dissection, occlusion, or
hemodynamically significant stenosis (greater than 50%).

Vertebral arteries: No evidence of dissection, occlusion, or
hemodynamically significant stenosis (greater than 50%).

Skeleton: Degenerative changes in the cervical spine. Poor dentition
several periapical lucencies and dental caries. No acute osseous
abnormality.

Other neck: No acute finding.

Upper chest: No focal pulmonary opacity or pleural effusion.

Review of the MIP images confirms the above findings

CTA HEAD FINDINGS

Anterior circulation: Both internal carotid arteries are patent to
the termini, without significant stenosis.

A1 segments patent. Normal anterior communicating artery. Anterior
cerebral arteries are patent to their distal aspects.

No M1 stenosis or occlusion. Normal MCA bifurcations. Distal MCA
branches perfused and symmetric.

Posterior circulation: Vertebral arteries patent to the
vertebrobasilar junction without stenosis. Posterior inferior
cerebellar arteries patent proximally.

Basilar patent to its distal aspect. Superior cerebellar arteries
patent proximally.

Patent P1 segments. PCAs perfused to their distal aspects without
stenosis. The right posterior communicating artery is visualized.
The left posterior communicating artery is not visualized.

Venous sinuses: As permitted by contrast timing, patent.

Anatomic variants: None significant.

Review of the MIP images confirms the above findings
IMPRESSION: 1.  No acute intracranial process.
2.  No intracranial large vessel occlusion or significant stenosis.
3.  No hemodynamically significant stenosis in the neck.

## 2023-12-20 ENCOUNTER — Other Ambulatory Visit: Payer: Self-pay | Admitting: Nurse Practitioner

## 2023-12-20 DIAGNOSIS — Z1231 Encounter for screening mammogram for malignant neoplasm of breast: Secondary | ICD-10-CM

## 2024-01-25 ENCOUNTER — Encounter: Payer: Self-pay | Admitting: Radiology

## 2024-01-25 ENCOUNTER — Ambulatory Visit
Admission: RE | Admit: 2024-01-25 | Discharge: 2024-01-25 | Disposition: A | Payer: Self-pay | Source: Ambulatory Visit | Attending: Nurse Practitioner | Admitting: Nurse Practitioner

## 2024-01-25 DIAGNOSIS — Z1231 Encounter for screening mammogram for malignant neoplasm of breast: Secondary | ICD-10-CM
